# Patient Record
Sex: Male | Born: 1955 | Race: White | Hispanic: No | Marital: Married | Smoking: Never smoker
Health system: Southern US, Community
[De-identification: ages and names within clinical notes are randomized; demographics above are authoritative.]

## PROBLEM LIST (undated history)

## (undated) DIAGNOSIS — B029 Zoster without complications: Secondary | ICD-10-CM

## (undated) DIAGNOSIS — I10 Essential (primary) hypertension: Secondary | ICD-10-CM

## (undated) DIAGNOSIS — F32A Depression, unspecified: Secondary | ICD-10-CM

## (undated) DIAGNOSIS — K219 Gastro-esophageal reflux disease without esophagitis: Secondary | ICD-10-CM

## (undated) DIAGNOSIS — D696 Thrombocytopenia, unspecified: Secondary | ICD-10-CM

## (undated) DIAGNOSIS — K5790 Diverticulosis of intestine, part unspecified, without perforation or abscess without bleeding: Secondary | ICD-10-CM

## (undated) DIAGNOSIS — K649 Unspecified hemorrhoids: Secondary | ICD-10-CM

## (undated) DIAGNOSIS — E039 Hypothyroidism, unspecified: Secondary | ICD-10-CM

## (undated) DIAGNOSIS — N182 Chronic kidney disease, stage 2 (mild): Secondary | ICD-10-CM

## (undated) DIAGNOSIS — D649 Anemia, unspecified: Secondary | ICD-10-CM

## (undated) DIAGNOSIS — F419 Anxiety disorder, unspecified: Secondary | ICD-10-CM

## (undated) DIAGNOSIS — I2510 Atherosclerotic heart disease of native coronary artery without angina pectoris: Principal | ICD-10-CM

## (undated) DIAGNOSIS — E785 Hyperlipidemia, unspecified: Secondary | ICD-10-CM

## (undated) DIAGNOSIS — F329 Major depressive disorder, single episode, unspecified: Secondary | ICD-10-CM

## (undated) DIAGNOSIS — K469 Unspecified abdominal hernia without obstruction or gangrene: Secondary | ICD-10-CM

## (undated) DIAGNOSIS — K759 Inflammatory liver disease, unspecified: Secondary | ICD-10-CM

## (undated) HISTORY — PX: CARDIAC CATHETERIZATION: SHX172

## (undated) HISTORY — DX: Hyperlipidemia, unspecified: E78.5

## (undated) HISTORY — DX: Major depressive disorder, single episode, unspecified: F32.9

## (undated) HISTORY — DX: Unspecified abdominal hernia without obstruction or gangrene: K46.9

## (undated) HISTORY — DX: Zoster without complications: B02.9

## (undated) HISTORY — DX: Essential (primary) hypertension: I10

## (undated) HISTORY — DX: Depression, unspecified: F32.A

## (undated) HISTORY — DX: Diverticulosis of intestine, part unspecified, without perforation or abscess without bleeding: K57.90

## (undated) HISTORY — PX: CARDIOVASCULAR STRESS TEST: SHX262

## (undated) HISTORY — PX: HERNIA REPAIR: SHX51

## (undated) HISTORY — DX: Chronic kidney disease, stage 2 (mild): N18.2

## (undated) HISTORY — DX: Anxiety disorder, unspecified: F41.9

## (undated) HISTORY — DX: Atherosclerotic heart disease of native coronary artery without angina pectoris: I25.10

## (undated) HISTORY — PX: ABDOMINAL HERNIA REPAIR: SHX539

## (undated) HISTORY — PX: TONSILLECTOMY: SUR1361

## (undated) HISTORY — DX: Morbid (severe) obesity due to excess calories: E66.01

## (undated) HISTORY — DX: Unspecified hemorrhoids: K64.9

## (undated) HISTORY — DX: Anemia, unspecified: D64.9

## (undated) HISTORY — DX: Thrombocytopenia, unspecified: D69.6

---

## 1993-05-30 HISTORY — PX: OTHER SURGICAL HISTORY: SHX169

## 1998-08-30 ENCOUNTER — Emergency Department (HOSPITAL_COMMUNITY): Admission: EM | Admit: 1998-08-30 | Discharge: 1998-08-30 | Payer: Self-pay

## 2002-06-11 ENCOUNTER — Encounter: Payer: Self-pay | Admitting: Emergency Medicine

## 2002-06-12 ENCOUNTER — Encounter: Payer: Self-pay | Admitting: Surgery

## 2002-06-12 ENCOUNTER — Inpatient Hospital Stay (HOSPITAL_COMMUNITY): Admission: EM | Admit: 2002-06-12 | Discharge: 2002-06-14 | Payer: Self-pay | Admitting: Emergency Medicine

## 2002-06-14 ENCOUNTER — Encounter: Payer: Self-pay | Admitting: Internal Medicine

## 2003-06-16 ENCOUNTER — Ambulatory Visit (HOSPITAL_COMMUNITY): Admission: RE | Admit: 2003-06-16 | Discharge: 2003-06-16 | Payer: Self-pay | Admitting: Internal Medicine

## 2006-07-30 DIAGNOSIS — I2510 Atherosclerotic heart disease of native coronary artery without angina pectoris: Secondary | ICD-10-CM

## 2006-07-30 HISTORY — DX: Atherosclerotic heart disease of native coronary artery without angina pectoris: I25.10

## 2006-08-30 HISTORY — PX: OTHER SURGICAL HISTORY: SHX169

## 2006-09-09 ENCOUNTER — Ambulatory Visit (HOSPITAL_COMMUNITY): Admission: RE | Admit: 2006-09-09 | Discharge: 2006-09-09 | Payer: Self-pay | Admitting: Cardiology

## 2011-06-01 ENCOUNTER — Encounter (INDEPENDENT_AMBULATORY_CARE_PROVIDER_SITE_OTHER): Payer: Self-pay | Admitting: General Surgery

## 2011-06-01 ENCOUNTER — Ambulatory Visit (INDEPENDENT_AMBULATORY_CARE_PROVIDER_SITE_OTHER): Payer: BC Managed Care – PPO | Admitting: General Surgery

## 2011-06-01 ENCOUNTER — Encounter (INDEPENDENT_AMBULATORY_CARE_PROVIDER_SITE_OTHER): Payer: Self-pay | Admitting: Surgery

## 2011-06-01 VITALS — BP 150/96 | HR 68 | Temp 97.2°F | Resp 16 | Ht 72.0 in | Wt 225.8 lb

## 2011-06-01 DIAGNOSIS — K432 Incisional hernia without obstruction or gangrene: Secondary | ICD-10-CM | POA: Insufficient documentation

## 2011-06-01 NOTE — Patient Instructions (Signed)
Hernia °A hernia occurs when an internal organ pushes out through a weak spot in the abdominal wall. Hernias most commonly occur in the groin and around the navel. Hernias often can be pushed back into place (reduced). Most hernias tend to get worse over time. Some abdominal hernias can get stuck in the opening (irreducible or incarcerated hernia) and cannot be reduced. An irreducible abdominal hernia which is tightly squeezed into the opening is at risk for impaired blood supply (strangulated hernia). A strangulated hernia is a medical emergency. Because of the risk for an irreducible or strangulated hernia, surgery may be recommended to repair a hernia. °CAUSES  °· Heavy lifting.  °· Prolonged coughing.  °· Straining to have a bowel movement.  °· A cut (incision) made during an abdominal surgery.  °HOME CARE INSTRUCTIONS  °· Bed rest is not required. You may continue your normal activities.  °· Avoid lifting more than 10 pounds (4.5 kg) or straining.  °· Cough gently. If you are a smoker it is best to stop. Even the best hernia repair can break down with the continual strain of coughing. Even if you do not have your hernia repaired, a cough will continue to aggravate the problem.  °· Do not wear anything tight over your hernia. Do not try to keep it in with an outside bandage or truss. These can damage abdominal contents if they are trapped within the hernia sac.  °· Eat a normal diet.  °· Avoid constipation. Straining over long periods of time will increase hernia size and encourage breakdown of repairs. If you cannot do this with diet alone, stool softeners may be used.  °SEEK IMMEDIATE MEDICAL CARE IF:  °· You have a fever.  °· You develop increasing abdominal pain.  °· You feel nauseous or vomit.  °· Your hernia is stuck outside the abdomen, looks discolored, feels hard, or is tender.  °· You have any changes in your bowel habits or in the hernia that are unusual for you.  °· You have increased pain or  swelling around the hernia.  °· You cannot push the hernia back in place by applying gentle pressure while lying down.  °MAKE SURE YOU:  °· Understand these instructions.  °· Will watch your condition.  °· Will get help right away if you are not doing well or get worse.  °Document Released: 07/16/2005 Document Revised: 03/28/2011 Document Reviewed: 03/04/2008 °[employer]® Patient Information ©2012 [employer]. °

## 2011-06-01 NOTE — Progress Notes (Signed)
Chief Complaint  Patient presents with  . Other    Eval ventral hernia    HPI Alexander Holloway is a 55 y.o. male.  Referred by Dr. Tyson Dense HPI This is a 55 year old male who underwent an open gastric bypass in [place] in 1994. He was 400 pounds at that time and has lost a significant amount of weight. He is doing very well at this point. He does have a history of a cardiac catheterization about 4 years ago for a syncopal episode which he says was essentially normal. He has had abdominal bulge that goes to the right of his incision present for a number of years. This occasionally causes him some discomfort but is otherwise asymptomatic. It is not tender. It always feels like it goes down when he lies down. He does not have any trouble with bowel movements or any nausea or vomiting associated with it. He comes in today to discuss his options in reference to this hernia.  Past Medical History  Diagnosis Date  . Hypertension   . Hyperlipidemia     Past Surgical History  Procedure Date  . 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 Heart cath 08/2006    Family History  Problem Relation Age of Onset  . Other Mother 23    circulatory  . Heart disease Father 35    Social History History  Substance Use Topics  . Smoking status: Never Smoker   . Smokeless tobacco: Never Used  . Alcohol Use: Yes     social    No Known Allergies  Current Outpatient Prescriptions  Medication Sig Dispense Refill  . CARDIZEM LA 300 MG 24 hr tablet 300 mg daily.       . cloNIDine (CATAPRES) 0.1 MG tablet 0.1 mg 2 (two) times daily.       Marland Kitchen levothyroxine (SYNTHROID, LEVOTHROID) 100 MCG tablet 100 mcg 2 (two) times daily.       . TEVETEN HCT 600-25 MG per tablet 1 tablet daily. Not taking at present per doctor order for the past two weeks. To see PCP today 06/01/11 to confirm if he will be put back on this medication.        Review of Systems Review of Systems  Constitutional: Negative for fever, chills and  unexpected weight change.  HENT: Negative for hearing loss, congestion, sore throat, trouble swallowing and voice change.   Eyes: Negative for visual disturbance.  Respiratory: Negative for cough and wheezing.   Cardiovascular: Negative for chest pain, palpitations and leg swelling.  Gastrointestinal: Negative for nausea, vomiting, abdominal pain, diarrhea, constipation, blood in stool, abdominal distention, anal bleeding and rectal pain.  Genitourinary: Negative for hematuria and difficulty urinating.  Musculoskeletal: Negative for arthralgias.  Skin: Negative for rash and wound.  Neurological: Negative for seizures, syncope, weakness and headaches.  Hematological: Negative for adenopathy. Does not bruise/bleed easily.  Psychiatric/Behavioral: Negative for confusion.    Blood pressure 150/96, pulse 68, temperature 97.2 F (36.2 C), temperature source Temporal, resp. rate 16, height 6' (1.829 m), weight 225 lb 12.8 oz (102.422 kg).  Physical Exam Physical Exam  Constitutional: He appears well-developed and well-nourished.  Eyes: No scleral icterus.  Neck: Neck supple.  Cardiovascular: Normal rate, regular rhythm and normal heart sounds.   Pulmonary/Chest: Effort normal and breath sounds normal. He has no wheezes. He has no rales.  Abdominal: Soft. Bowel sounds are normal. He exhibits no mass. There is no tenderness. A hernia is present.    Lymphadenopathy:  He has no cervical adenopathy.      Assessment    Incisional hernia    Plan    We discussed indications for repairs of hernias including prevention of any further problems as well as the fact that this will likely worsen over time. We discussed at length the hospital course as well as to recovery after her hernia repair. I discussed both a laparoscopic repair with mesh as well as an open repair with mesh. We also discussed component separation. I discussed with him that I would like to obtain a CT scan if he chooses to  have this hernia repaired so that he and I can discuss the best plan of approaching this hernia. We discussed all the risks associated with that. I don't think this is urgent that he have his hernia repaired. He is going to go home and discuss this with his wife and will get back to me.      [name] 06/01/2011, 10:13 AM

## 2011-07-04 ENCOUNTER — Ambulatory Visit (INDEPENDENT_AMBULATORY_CARE_PROVIDER_SITE_OTHER): Payer: BC Managed Care – PPO | Admitting: General Surgery

## 2011-07-04 ENCOUNTER — Encounter (INDEPENDENT_AMBULATORY_CARE_PROVIDER_SITE_OTHER): Payer: Self-pay | Admitting: General Surgery

## 2011-07-04 VITALS — BP 140/80 | HR 64 | Temp 97.0°F | Resp 16 | Ht 72.0 in | Wt 229.4 lb

## 2011-07-04 DIAGNOSIS — K432 Incisional hernia without obstruction or gangrene: Secondary | ICD-10-CM

## 2011-07-04 NOTE — Progress Notes (Signed)
Subjective:     Patient ID: EISSA BUCHBERGER, male   DOB: 03-08-1956, 55 y.o.   MRN: [id number]  HPI This is a 55 year old male who I saw previously with an incisional hernia after an open gastric bypass in the past. He is unchanged since I last saw him. He comes in today with his wife to discuss hernia repair as we did last time. He is very anxious today.  Review of Systems     Objective:   Physical Exam Incisional hernia rlq unchanged since last visit    Assessment:     Incisional hernia    Plan:        We discussed for about 20 minutes of any incisional hernia repair. I discussed both an open and laparoscopic repair. We discussed the risks and benefits associated with both of those. I told him that I would like to obtain a CT scan prior to scheduling him. We discussed the time of repair. They have a cruise set up for January which I think he would be best for them to go on a cruise and then plan on repair after that. We're going to obtain a CT scan and then he will see me in early January plan on scheduling and going over operation more detail.

## 2011-07-06 ENCOUNTER — Telehealth (INDEPENDENT_AMBULATORY_CARE_PROVIDER_SITE_OTHER): Payer: Self-pay

## 2011-07-06 NOTE — Telephone Encounter (Signed)
Called Dr Norris Cross office to see about cardiac clearance on the pt for his surgery that is going to be scheduled in January. The pt was cleared on 06-07-11 and they will fax the clearance to me today./ [facility]

## 2011-07-27 ENCOUNTER — Telehealth (INDEPENDENT_AMBULATORY_CARE_PROVIDER_SITE_OTHER): Payer: Self-pay | Admitting: General Surgery

## 2011-07-27 ENCOUNTER — Ambulatory Visit
Admission: RE | Admit: 2011-07-27 | Discharge: 2011-07-27 | Disposition: A | Discharge status: Home / Self Care | Payer: BC Managed Care – PPO | Source: Ambulatory Visit | Attending: General Surgery | Admitting: General Surgery

## 2011-07-27 DIAGNOSIS — K432 Incisional hernia without obstruction or gangrene: Secondary | ICD-10-CM

## 2011-07-27 MED ORDER — IOHEXOL 300 MG/ML  SOLN
125.0000 mL | Freq: Once | INTRAMUSCULAR | Status: AC | PRN
  Administered 2011-07-27: 125 mL via INTRAVENOUS

## 2011-07-27 NOTE — Telephone Encounter (Signed)
Pt is aware of his CT A/P report and to keep appt on the 08/03/11

## 2011-08-01 ENCOUNTER — Other Ambulatory Visit: Payer: Self-pay

## 2011-08-02 ENCOUNTER — Encounter (INDEPENDENT_AMBULATORY_CARE_PROVIDER_SITE_OTHER): Payer: Self-pay | Admitting: General Surgery

## 2011-08-03 ENCOUNTER — Ambulatory Visit (INDEPENDENT_AMBULATORY_CARE_PROVIDER_SITE_OTHER): Payer: BC Managed Care – PPO | Admitting: General Surgery

## 2011-08-03 ENCOUNTER — Encounter (INDEPENDENT_AMBULATORY_CARE_PROVIDER_SITE_OTHER): Payer: Self-pay | Admitting: General Surgery

## 2011-08-03 VITALS — BP 152/100 | HR 66 | Temp 98.0°F | Ht 72.0 in | Wt 232.8 lb

## 2011-08-03 DIAGNOSIS — K432 Incisional hernia without obstruction or gangrene: Secondary | ICD-10-CM

## 2011-08-03 NOTE — Progress Notes (Signed)
Patient ID: Alexander Holloway, male   DOB: 22-Feb-1956, 56 y.o.   MRN: [id number]  Chief Complaint  Patient presents with  . Pre-op Exam    appt before hernia sx    HPI Alexander Holloway is a 56 y.o. male.   HPI This is a 56 year old male who I've seen a couple of times before. He had an open gastric bypass with a good weight loss results. Recently he has had the onset of a fairly large symptomatic ventral hernia. I sent him to get a CT scan since the last time I saw him and he returns today to schedule the surgery as well as to review his CT scan. This area continue to bother him and he has increasing discomfort. A CT scan shows a fairly large defect with a fair amount of bowel herniated into this. We reviewed the pictures today as well as discussed the etiology of hernia formation.  Past Medical History  Diagnosis Date  . Hypertension   . Hyperlipidemia     Past Surgical History  Procedure Date  . 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 Heart cath 08/2006    Family History  Problem Relation Age of Onset  . Other Mother 64    circulatory  . Heart disease Father 1    Social History History  Substance Use Topics  . Smoking status: Never Smoker   . Smokeless tobacco: Never Used  . Alcohol Use: Yes     social    No Known Allergies  Current Outpatient Prescriptions  Medication Sig Dispense Refill  . CARDIZEM LA 300 MG 24 hr tablet 300 mg daily.       . cloNIDine (CATAPRES) 0.1 MG tablet 0.2 mg 2 (two) times daily.       Marland Kitchen eprosartan (TEVETEN) 600 MG tablet       . levothyroxine (SYNTHROID, LEVOTHROID) 100 MCG tablet 100 mcg 2 (two) times daily.         Review of Systems Review of Systems  Blood pressure 152/100, pulse 66, temperature 98 F (36.7 C), temperature source Temporal, height 6' (1.829 m), weight 232 lb 12.8 oz (105.597 kg), SpO2 98.00%.  Physical Exam Physical Exam  Constitutional: He appears well-developed and well-nourished.  Cardiovascular: Normal rate, regular rhythm  and normal heart sounds.   Pulmonary/Chest: Effort normal and breath sounds normal. He has no wheezes. He has no rales.  Abdominal: Soft. A hernia is present.    Lymphadenopathy:    He has no cervical adenopathy.    Data Reviewed CT ABDOMEN AND PELVIS WITH CONTRAST  Technique: Multidetector CT imaging of the abdomen and pelvis was  performed following the standard protocol during bolus  administration of intravenous contrast.  Contrast: OMNIPAQUE IOHEXOL 300 MG/ML IV SOLN  Comparison: Report from CT abdomen pelvis of November 2003 (images  no longer available.  Findings: The imaged lung bases are well expanded. Small bleb left  lower lobe. Mild cardiomegaly with coronary artery  atherosclerosis. Mild gaseous distention of the distal esophagus.  There is a large midline ventral abdominal wall hernia. Maximal  transverse diameter of the mouth of the hernia is 9.0 cm. The  hernia spans approximately 11 cm in craniocaudal span. The hernia  sac is positioned within the midline and right lateral anterior  abdominal wall. The hernia sac measures approximately 21 cm  transverse diameter by 7.2 cm AP diameter by 13 cm craniocaudal  span. The hernia sac contains multiple normal caliber small bowel  loops, a small  segment of the sigmoid colon, mesenteric fat, and  mesenteric vessels.  It is noted that the patient's sigmoid colon is long and redundant  and is present within the right abdomen/pelvis as well as the left  aspect of the pelvis. There is no evidence of bowel obstruction,  ischemia, or inflammation.  Cholecystectomy. Postsurgical changes of gastric bypass surgery  noted. Intra-abdominal small bowel loops and colon are normal in  caliber.  There is a 2.7 cm simple cyst in the upper pole of the left kidney.  Too small to characterize 5 mm low density lesion in the lower pole  of the left kidney statistically most likely a cyst. Right kidney  is within normal limits.  The  liver, spleen, adrenal glands, and pancreas are within normal  limits.  Negative for ascites or lymphadenopathy.  Abdominal aorta is normal in caliber. There is some  atherosclerotic calcification of the right common iliac artery and  proximal right femoral artery. Atherosclerotic calcification of  branches of the internal iliac arteries bilaterally.  Urinary bladder is not very distended at the time imaging, likely  accounting for apparent bladder wall thickening. The prostate gland  is within normal limits for size. Seminal vesicles are  unremarkable. Inguinal regions appear normal.  There are bilateral pars defects at L5, with approximately 4 mm  anterolisthesis of L5 on S1.  IMPRESSION:  1. Large ventral abdominal wall hernia as described above. The  hernia sac contains multiple small bowel loops, a portion of the  sigmoid colon, mesenteric fat and vasculature.  2. No evidence of bowel obstruction.  3. Simple left renal cyst.  4. Coronary artery atherosclerosis and mild cardiomegaly.  5. Previous gastric bypass surgery and cholecystectomy.  6. Grade 1 spondylolisthesis at L5-S1.    Assessment    Incisional hernia    Plan   We again a long discussion about the pathophysiology of hernia formation. I showed him his CAT scan today and we reviewed the findings on it. He has a medium-sized defect with a fair amount of bowel herniating through this. I recommended again to him today to have this repaired due to the fact that he is at risk for incarceration as well as he's going to have loss of domain at some point with this. I recommended to him today an open ventral hernia repair with mesh. I discussed with him also the possibility of component separation. We'll long discussion about the difference being open versus laparoscopic ventral hernia repair. I think for him an open ventral hernia repair will be the least risk as well as given him the most opportunity to have a durable repair. I  discussed the risks of hernia repair being but not limited to bleeding, infection, infection of the mesh requiring reoperation, wound dehiscence, need to heal by secondary intention, drain placement, ileus, DVT, PE, recurrence of his hernia. He understands all of these risks. We'll plan on scheduling for the second week in February per his request.        Emelia Loron 08/03/2011, 2:31 PM

## 2011-08-28 ENCOUNTER — Encounter (HOSPITAL_COMMUNITY): Payer: Self-pay | Admitting: Pharmacy Technician

## 2011-09-04 NOTE — Pre-Procedure Instructions (Signed)
20 TYTUS STRAHLE  09/04/2011   Your procedure is scheduled on:  Wed. Feb 13 @ 0830  Report to [facility] at 0630 AM.  Call this number if you have problems the morning of surgery: [phone]   Remember:   Do not eat food:After Midnight.  May have clear liquids: up to 4 Hours before arrival.(until 2:30 am)  Clear liquids include soda, tea, black coffee, apple or grape juice, broth.  Take these medicines the morning of surgery with A SIP OF WATER: Cardizem,Clonidine,Synthroid,and Omeprazole   Do not wear jewelry, make-up or nail polish.  Do not wear lotions, powders, or perfumes. You may wear deodorant.  Do not shave 48 hours prior to surgery.  Do not bring valuables to the hospital.  Contacts, dentures or bridgework may not be worn into surgery.  Leave suitcase in the car. After surgery it may be brought to your room.  For patients admitted to the hospital, checkout time is 11:00 AM the day of discharge.   Patients discharged the day of surgery will not be allowed to drive home.  Name and phone number of your driver:   Special Instructions: CHG Shower Use Special Wash: 1/2 bottle night before surgery and 1/2 bottle morning of surgery.   Please read over the following fact sheets that you were given: Pain Booklet, Coughing and Deep Breathing, MRSA Information and Surgical Site Infection Prevention

## 2011-09-05 ENCOUNTER — Encounter (HOSPITAL_COMMUNITY)
Admission: RE | Admit: 2011-09-05 | Discharge: 2011-09-05 | Disposition: A | Discharge status: Home / Self Care | Payer: BC Managed Care – PPO | Source: Ambulatory Visit | Attending: General Surgery | Admitting: General Surgery

## 2011-09-05 ENCOUNTER — Encounter (HOSPITAL_COMMUNITY): Payer: Self-pay

## 2011-09-05 ENCOUNTER — Ambulatory Visit (HOSPITAL_COMMUNITY)
Admission: RE | Admit: 2011-09-05 | Discharge: 2011-09-05 | Disposition: A | Discharge status: Home / Self Care | Payer: BC Managed Care – PPO | Source: Ambulatory Visit | Attending: Anesthesiology | Admitting: Anesthesiology

## 2011-09-05 DIAGNOSIS — I10 Essential (primary) hypertension: Secondary | ICD-10-CM | POA: Insufficient documentation

## 2011-09-05 DIAGNOSIS — Z01818 Encounter for other preprocedural examination: Secondary | ICD-10-CM | POA: Insufficient documentation

## 2011-09-05 DIAGNOSIS — Z01812 Encounter for preprocedural laboratory examination: Secondary | ICD-10-CM | POA: Insufficient documentation

## 2011-09-05 DIAGNOSIS — K439 Ventral hernia without obstruction or gangrene: Secondary | ICD-10-CM | POA: Insufficient documentation

## 2011-09-05 HISTORY — DX: Gastro-esophageal reflux disease without esophagitis: K21.9

## 2011-09-05 HISTORY — DX: Hypothyroidism, unspecified: E03.9

## 2011-09-05 HISTORY — DX: Inflammatory liver disease, unspecified: K75.9

## 2011-09-05 LAB — CBC
HCT: 37.7 % — ABNORMAL LOW (ref 39.0–52.0)
MCHC: 34.2 g/dL (ref 30.0–36.0)
MCV: 93.5 fL (ref 78.0–100.0)
RDW: 12.4 % (ref 11.5–15.5)
WBC: 8.6 10*3/uL (ref 4.0–10.5)

## 2011-09-05 LAB — COMPREHENSIVE METABOLIC PANEL
BUN: 14 mg/dL (ref 6–23)
CO2: 24 mEq/L (ref 19–32)
Chloride: 103 mEq/L (ref 96–112)
Creatinine, Ser: 1.21 mg/dL (ref 0.50–1.35)
GFR calc non Af Amer: 65 mL/min — ABNORMAL LOW (ref >90)
Glucose, Bld: 93 mg/dL (ref 70–99)
Total Bilirubin: 0.4 mg/dL (ref 0.3–1.2)

## 2011-09-05 LAB — APTT: aPTT: 28 seconds (ref 24–37)

## 2011-09-05 LAB — PROTIME-INR: Prothrombin Time: 14 seconds (ref 11.6–15.2)

## 2011-09-07 ENCOUNTER — Encounter (INDEPENDENT_AMBULATORY_CARE_PROVIDER_SITE_OTHER): Payer: Self-pay | Admitting: General Surgery

## 2011-09-07 ENCOUNTER — Ambulatory Visit (INDEPENDENT_AMBULATORY_CARE_PROVIDER_SITE_OTHER): Payer: BC Managed Care – PPO | Admitting: General Surgery

## 2011-09-07 VITALS — BP 118/82 | HR 68 | Temp 97.8°F | Resp 18 | Ht 72.0 in | Wt 229.4 lb

## 2011-09-07 DIAGNOSIS — K469 Unspecified abdominal hernia without obstruction or gangrene: Secondary | ICD-10-CM | POA: Insufficient documentation

## 2011-09-07 DIAGNOSIS — K432 Incisional hernia without obstruction or gangrene: Secondary | ICD-10-CM

## 2011-09-07 NOTE — Progress Notes (Signed)
Patient ID: Alexander Holloway, male   DOB: 12/31/1955, 55 y.o.   MRN: [id number] HPI  This is a 55-year-old male who I've seen a couple of times before. He had an open gastric bypass with a good weight loss results. Recently he has had the onset of a fairly large symptomatic ventral hernia. I sent him to get a CT scan since the last time I saw him and he returns today to schedule the surgery as well as to review his CT scan. This area continue to bother him and he has increasing discomfort. A CT scan shows a fairly large defect with a fair amount of bowel herniated into this. He returns today without any significant change.  This hernia is more sore and he thinks is larger than the last time I saw him.  Past Medical History   Diagnosis  Date   .  Hypertension    .  Hyperlipidemia     Past Surgical History   Procedure  Date   .  Gastic bypass  05/1993   .  Heart cath  08/2006    Family History   Problem  Relation  Age of Onset   .  Other  Mother  54      circulatory    .  Heart disease  Father  47    Social History  History   Substance Use Topics   .  Smoking status:  Never Smoker   .  Smokeless tobacco:  Never Used   .  Alcohol Use:  Yes      social    No Known Allergies  Current Outpatient Prescriptions   Medication  Sig  Dispense  Refill   .  CARDIZEM LA 300 MG 24 hr tablet  300 mg daily.     .  cloNIDine (CATAPRES) 0.1 MG tablet  0.2 mg 2 (two) times daily.     .  eprosartan (TEVETEN) 600 MG tablet      .  levothyroxine (SYNTHROID, LEVOTHROID) 100 MCG tablet  100 mcg 2 (two) times daily.       Physical Exam  Physical Exam  Constitutional: He appears well-developed and well-nourished.  Cardiovascular: Normal rate, regular rhythm and normal heart sounds.  Pulmonary/Chest: Effort normal and breath sounds normal. He has no wheezes. He has no rales.  Abdominal: Soft. A hernia is present. Well healed midline incision with large rlq hernia, mildly tender, difficult to reduce all of  this   Lymphadenopathy:  He has no cervical adenopathy.   Data Reviewed  CT ABDOMEN AND PELVIS WITH CONTRAST  Technique: Multidetector CT imaging of the abdomen and pelvis was  performed following the standard protocol during bolus  administration of intravenous contrast.  Contrast: 125mL OMNIPAQUE IOHEXOL 300 MG/ML IV SOLN  Comparison: Report from CT abdomen pelvis of November 2003 (images  no longer available.  Findings: The imaged lung bases are well expanded. Small bleb left  lower lobe. Mild cardiomegaly with coronary artery  atherosclerosis. Mild gaseous distention of the distal esophagus.  There is a large midline ventral abdominal wall hernia. Maximal  transverse diameter of the mouth of the hernia is 9.0 cm. The  hernia spans approximately 11 cm in craniocaudal span. The hernia  sac is positioned within the midline and right lateral anterior  abdominal wall. The hernia sac measures approximately 21 cm  transverse diameter by 7.2 cm AP diameter by 13 cm craniocaudal  span. The hernia sac contains multiple normal caliber small bowel    loops, a small segment of the sigmoid colon, mesenteric fat, and  mesenteric vessels.  It is noted that the patient's sigmoid colon is long and redundant  and is present within the right abdomen/pelvis as well as the left  aspect of the pelvis. There is no evidence of bowel obstruction,  ischemia, or inflammation.  Cholecystectomy. Postsurgical changes of gastric bypass surgery  noted. Intra-abdominal small bowel loops and colon are normal in  caliber.  There is a 2.7 cm simple cyst in the upper pole of the left kidney.  Too small to characterize 5 mm low density lesion in the lower pole  of the left kidney statistically most likely a cyst. Right kidney  is within normal limits.  The liver, spleen, adrenal glands, and pancreas are within normal  limits.  Negative for ascites or lymphadenopathy.  Abdominal aorta is normal in caliber. There  is some  atherosclerotic calcification of the right common iliac artery and  proximal right femoral artery. Atherosclerotic calcification of  branches of the internal iliac arteries bilaterally.  Urinary bladder is not very distended at the time imaging, likely  accounting for apparent bladder wall thickening. The prostate gland  is within normal limits for size. Seminal vesicles are  unremarkable. Inguinal regions appear normal.  There are bilateral pars defects at L5, with approximately 4 mm  anterolisthesis of L5 on S1.  IMPRESSION:  1. Large ventral abdominal wall hernia as described above. The  hernia sac contains multiple small bowel loops, a portion of the  sigmoid colon, mesenteric fat and vasculature.  2. No evidence of bowel obstruction.  3. Simple left renal cyst.  4. Coronary artery atherosclerosis and mild cardiomegaly.  5. Previous gastric bypass surgery and cholecystectomy.  6. Grade 1 spondylolisthesis at L5-S1.  Assessment   Incisional hernia   Plan . I recommended to him today an open ventral hernia repair with mesh. I discussed with him also the possibility of component separation. We'll long discussion about the difference being open versus laparoscopic ventral hernia repair. I think for him an open ventral hernia repair will be the least risk as well as given him the most opportunity to have a durable repair. I discussed the risks of hernia repair being but not limited to bleeding, infection, infection of the mesh requiring reoperation, wound dehiscence, need to heal by secondary intention, drain placement, ileus, DVT, PE, recurrence of his hernia. He understands all of these risks.     

## 2011-09-11 MED ORDER — CEFAZOLIN SODIUM-DEXTROSE 2-3 GM-% IV SOLR
2.0000 g | INTRAVENOUS | Status: AC
  Administered 2011-09-12: 2 g via INTRAVENOUS
  Filled 2011-09-11: qty 50

## 2011-09-12 ENCOUNTER — Encounter (HOSPITAL_COMMUNITY): Payer: Self-pay | Admitting: Anesthesiology

## 2011-09-12 ENCOUNTER — Other Ambulatory Visit (INDEPENDENT_AMBULATORY_CARE_PROVIDER_SITE_OTHER): Payer: Self-pay | Admitting: General Surgery

## 2011-09-12 ENCOUNTER — Encounter (HOSPITAL_COMMUNITY): Payer: Self-pay

## 2011-09-12 ENCOUNTER — Inpatient Hospital Stay (HOSPITAL_COMMUNITY)
Admission: RE | Admit: 2011-09-12 | Discharge: 2011-09-19 | DRG: 554 | Disposition: A | Discharge status: Home / Self Care | Payer: BC Managed Care – PPO | Source: Ambulatory Visit | Attending: General Surgery | Admitting: General Surgery

## 2011-09-12 ENCOUNTER — Encounter (HOSPITAL_COMMUNITY)
Admission: RE | Disposition: A | Discharge status: Home / Self Care | Payer: Self-pay | Source: Ambulatory Visit | Attending: General Surgery

## 2011-09-12 ENCOUNTER — Ambulatory Visit (HOSPITAL_COMMUNITY): Payer: BC Managed Care – PPO | Admitting: Anesthesiology

## 2011-09-12 DIAGNOSIS — D62 Acute posthemorrhagic anemia: Secondary | ICD-10-CM | POA: Diagnosis not present

## 2011-09-12 DIAGNOSIS — I10 Essential (primary) hypertension: Secondary | ICD-10-CM | POA: Diagnosis present

## 2011-09-12 DIAGNOSIS — K432 Incisional hernia without obstruction or gangrene: Principal | ICD-10-CM | POA: Diagnosis present

## 2011-09-12 DIAGNOSIS — K439 Ventral hernia without obstruction or gangrene: Secondary | ICD-10-CM

## 2011-09-12 DIAGNOSIS — Y834 Other reconstructive surgery as the cause of abnormal reaction of the patient, or of later complication, without mention of misadventure at the time of the procedure: Secondary | ICD-10-CM | POA: Diagnosis not present

## 2011-09-12 DIAGNOSIS — M4310 Spondylolisthesis, site unspecified: Secondary | ICD-10-CM | POA: Diagnosis present

## 2011-09-12 DIAGNOSIS — E039 Hypothyroidism, unspecified: Secondary | ICD-10-CM | POA: Diagnosis present

## 2011-09-12 DIAGNOSIS — I2510 Atherosclerotic heart disease of native coronary artery without angina pectoris: Secondary | ICD-10-CM | POA: Diagnosis present

## 2011-09-12 DIAGNOSIS — E785 Hyperlipidemia, unspecified: Secondary | ICD-10-CM | POA: Diagnosis present

## 2011-09-12 DIAGNOSIS — K219 Gastro-esophageal reflux disease without esophagitis: Secondary | ICD-10-CM | POA: Diagnosis present

## 2011-09-12 DIAGNOSIS — Y9210 Unspecified residential institution as the place of occurrence of the external cause: Secondary | ICD-10-CM | POA: Diagnosis not present

## 2011-09-12 DIAGNOSIS — IMO0002 Reserved for concepts with insufficient information to code with codable children: Secondary | ICD-10-CM | POA: Diagnosis not present

## 2011-09-12 DIAGNOSIS — N179 Acute kidney failure, unspecified: Secondary | ICD-10-CM | POA: Diagnosis not present

## 2011-09-12 HISTORY — PX: VENTRAL HERNIA REPAIR: SHX424

## 2011-09-12 HISTORY — PX: PANNICULECTOMY: SHX5360

## 2011-09-12 SURGERY — REPAIR, HERNIA, VENTRAL
Anesthesia: General | Site: Abdomen | Wound class: Clean

## 2011-09-12 MED ORDER — DIPHENHYDRAMINE HCL 12.5 MG/5ML PO ELIX
12.5000 mg | ORAL_SOLUTION | Freq: Four times a day (QID) | ORAL | Status: DC | PRN
  Filled 2011-09-12: qty 5

## 2011-09-12 MED ORDER — NALOXONE HCL 0.4 MG/ML IJ SOLN
0.4000 mg | INTRAMUSCULAR | Status: DC | PRN
  Filled 2011-09-12: qty 1

## 2011-09-12 MED ORDER — DILTIAZEM HCL ER COATED BEADS 300 MG PO TB24: 300.0000 mg | ORAL_TABLET | Freq: Every day | ORAL | Status: DC

## 2011-09-12 MED ORDER — NEOSTIGMINE METHYLSULFATE 1 MG/ML IJ SOLN
INTRAMUSCULAR | Status: DC | PRN
  Administered 2011-09-12: 5 mg via INTRAVENOUS

## 2011-09-12 MED ORDER — PANTOPRAZOLE SODIUM 40 MG IV SOLR
40.0000 mg | Freq: Every day | INTRAVENOUS | Status: DC
  Administered 2011-09-12 – 2011-09-16 (×5): 40 mg via INTRAVENOUS
  Filled 2011-09-12 (×6): qty 40

## 2011-09-12 MED ORDER — MORPHINE SULFATE (PF) 1 MG/ML IV SOLN
INTRAVENOUS | Status: AC
  Filled 2011-09-12: qty 25

## 2011-09-12 MED ORDER — MORPHINE SULFATE (PF) 1 MG/ML IV SOLN
INTRAVENOUS | Status: DC
  Administered 2011-09-12: 13.5 mg via INTRAVENOUS
  Administered 2011-09-12 (×2): via INTRAVENOUS
  Administered 2011-09-12: 13.5 mg via INTRAVENOUS
  Administered 2011-09-13: 06:00:00 via INTRAVENOUS
  Administered 2011-09-13: 7.5 mg via INTRAVENOUS
  Administered 2011-09-13: 9 mg via INTRAVENOUS
  Administered 2011-09-13: 10.06 mg via INTRAVENOUS
  Administered 2011-09-13: 6 mg via INTRAVENOUS
  Administered 2011-09-13: 7.5 mg via INTRAVENOUS
  Administered 2011-09-13: 15:00:00 via INTRAVENOUS
  Administered 2011-09-13: 9 mg via INTRAVENOUS
  Administered 2011-09-14: 1.5 mg via INTRAVENOUS
  Administered 2011-09-14: 7.5 mg via INTRAVENOUS
  Administered 2011-09-14: 1.5 mg via INTRAVENOUS
  Administered 2011-09-14: 16:00:00 via INTRAVENOUS
  Administered 2011-09-14: 25 mL via INTRAVENOUS
  Administered 2011-09-14: 6 mg via INTRAVENOUS
  Administered 2011-09-15: 04:00:00 via INTRAVENOUS
  Administered 2011-09-15: 3 mL via INTRAVENOUS
  Administered 2011-09-15: 12 mg via INTRAVENOUS
  Administered 2011-09-15: 15:00:00 via INTRAVENOUS
  Administered 2011-09-15 (×2): 10.5 mg via INTRAVENOUS
  Administered 2011-09-15: 2.62 mg via INTRAVENOUS
  Administered 2011-09-15: 9 mg via INTRAVENOUS
  Administered 2011-09-16: 18:00:00 via INTRAVENOUS
  Administered 2011-09-16 (×2): 6 mg via INTRAVENOUS
  Administered 2011-09-16 – 2011-09-17 (×3): 3 mg via INTRAVENOUS
  Filled 2011-09-12: qty 0

## 2011-09-12 MED ORDER — DIPHENHYDRAMINE HCL 50 MG/ML IJ SOLN
12.5000 mg | Freq: Four times a day (QID) | INTRAMUSCULAR | Status: DC | PRN
  Filled 2011-09-12: qty 0.25

## 2011-09-12 MED ORDER — HYDROMORPHONE HCL PF 1 MG/ML IJ SOLN
INTRAMUSCULAR | Status: AC
  Administered 2011-09-12: 0.5 mg via INTRAVENOUS
  Filled 2011-09-12: qty 1

## 2011-09-12 MED ORDER — ONDANSETRON HCL 4 MG/2ML IJ SOLN
4.0000 mg | Freq: Four times a day (QID) | INTRAMUSCULAR | Status: DC | PRN
  Administered 2011-09-13: 4 mg via INTRAVENOUS
  Filled 2011-09-12 (×2): qty 2

## 2011-09-12 MED ORDER — SODIUM CHLORIDE 0.9 % IV SOLN
INTRAVENOUS | Status: DC
  Administered 2011-09-12 – 2011-09-15 (×7): via INTRAVENOUS
  Administered 2011-09-15: 125 mL/h via INTRAVENOUS
  Administered 2011-09-16 (×3): via INTRAVENOUS
  Administered 2011-09-16: 125 mL via INTRAVENOUS
  Administered 2011-09-17: 20 mL/h via INTRAVENOUS
  Administered 2011-09-18: 10 mL via INTRAVENOUS

## 2011-09-12 MED ORDER — CEFAZOLIN SODIUM-DEXTROSE 2-3 GM-% IV SOLR
2.0000 g | Freq: Three times a day (TID) | INTRAVENOUS | Status: AC
  Administered 2011-09-12 – 2011-09-13 (×3): 2 g via INTRAVENOUS
  Filled 2011-09-12 (×3): qty 50

## 2011-09-12 MED ORDER — HYDROMORPHONE HCL PF 1 MG/ML IJ SOLN
0.2500 mg | INTRAMUSCULAR | Status: DC | PRN
  Administered 2011-09-12 (×4): 0.5 mg via INTRAVENOUS

## 2011-09-12 MED ORDER — DILTIAZEM HCL ER COATED BEADS 300 MG PO CP24
300.0000 mg | ORAL_CAPSULE | Freq: Every day | ORAL | Status: DC
  Administered 2011-09-12 – 2011-09-13 (×2): 300 mg via ORAL
  Filled 2011-09-12 (×3): qty 1

## 2011-09-12 MED ORDER — ONDANSETRON HCL 4 MG/2ML IJ SOLN
INTRAMUSCULAR | Status: DC | PRN
  Administered 2011-09-12: 4 mg via INTRAVENOUS

## 2011-09-12 MED ORDER — CHLORHEXIDINE GLUCONATE 0.12 % MT SOLN
15.0000 mL | Freq: Two times a day (BID) | OROMUCOSAL | Status: DC
  Administered 2011-09-12 – 2011-09-17 (×11): 15 mL via OROMUCOSAL
  Filled 2011-09-12 (×10): qty 15

## 2011-09-12 MED ORDER — BIOTENE DRY MOUTH MT LIQD
15.0000 mL | Freq: Two times a day (BID) | OROMUCOSAL | Status: DC
  Administered 2011-09-12 – 2011-09-17 (×8): 15 mL via OROMUCOSAL

## 2011-09-12 MED ORDER — ONDANSETRON HCL 4 MG/2ML IJ SOLN: 4.0000 mg | Freq: Four times a day (QID) | INTRAMUSCULAR | Status: DC | PRN

## 2011-09-12 MED ORDER — ACETAMINOPHEN 650 MG RE SUPP: 650.0000 mg | Freq: Four times a day (QID) | RECTAL | Status: DC | PRN

## 2011-09-12 MED ORDER — MIDAZOLAM HCL 5 MG/5ML IJ SOLN
INTRAMUSCULAR | Status: DC | PRN
  Administered 2011-09-12: 2 mg via INTRAVENOUS

## 2011-09-12 MED ORDER — VECURONIUM BROMIDE 10 MG IV SOLR
INTRAVENOUS | Status: DC | PRN
  Administered 2011-09-12: 2 mg via INTRAVENOUS
  Administered 2011-09-12: 10 mg via INTRAVENOUS
  Administered 2011-09-12 (×2): 2 mg via INTRAVENOUS

## 2011-09-12 MED ORDER — HEPARIN SODIUM (PORCINE) 5000 UNIT/ML IJ SOLN
5000.0000 [IU] | Freq: Once | INTRAMUSCULAR | Status: AC
  Administered 2011-09-12: 5000 [IU] via SUBCUTANEOUS
  Filled 2011-09-12: qty 1

## 2011-09-12 MED ORDER — SODIUM CHLORIDE 0.9 % IJ SOLN: 9.0000 mL | INTRAMUSCULAR | Status: DC | PRN

## 2011-09-12 MED ORDER — PROPOFOL 10 MG/ML IV EMUL
INTRAVENOUS | Status: DC | PRN
  Administered 2011-09-12: 200 mg via INTRAVENOUS

## 2011-09-12 MED ORDER — 0.9 % SODIUM CHLORIDE (POUR BTL) OPTIME
TOPICAL | Status: DC | PRN
  Administered 2011-09-12 (×3): 1000 mL

## 2011-09-12 MED ORDER — LACTATED RINGERS IV SOLN
INTRAVENOUS | Status: DC | PRN
  Administered 2011-09-12 (×3): via INTRAVENOUS

## 2011-09-12 MED ORDER — ACETAMINOPHEN 325 MG PO TABS
650.0000 mg | ORAL_TABLET | Freq: Four times a day (QID) | ORAL | Status: DC | PRN
  Administered 2011-09-13 – 2011-09-19 (×7): 650 mg via ORAL
  Filled 2011-09-12 (×7): qty 2

## 2011-09-12 MED ORDER — FENTANYL CITRATE 0.05 MG/ML IJ SOLN
INTRAMUSCULAR | Status: DC | PRN
  Administered 2011-09-12: 50 ug via INTRAVENOUS
  Administered 2011-09-12: 100 ug via INTRAVENOUS
  Administered 2011-09-12: 150 ug via INTRAVENOUS
  Administered 2011-09-12: 100 ug via INTRAVENOUS
  Administered 2011-09-12 (×2): 50 ug via INTRAVENOUS

## 2011-09-12 MED ORDER — ATROPINE SULFATE 0.4 MG/ML IJ SOLN
INTRAMUSCULAR | Status: DC | PRN
  Administered 2011-09-12: .3 mg via INTRAVENOUS

## 2011-09-12 MED ORDER — EPHEDRINE SULFATE 50 MG/ML IJ SOLN
INTRAMUSCULAR | Status: DC | PRN
  Administered 2011-09-12: 10 mg via INTRAVENOUS

## 2011-09-12 MED ORDER — GLYCOPYRROLATE 0.2 MG/ML IJ SOLN
INTRAMUSCULAR | Status: DC | PRN
  Administered 2011-09-12: .8 mg via INTRAVENOUS
  Administered 2011-09-12: 0.2 mg via INTRAVENOUS

## 2011-09-12 MED ORDER — CLONIDINE HCL 0.2 MG PO TABS
0.2000 mg | ORAL_TABLET | Freq: Two times a day (BID) | ORAL | Status: DC
  Administered 2011-09-12: 0.2 mg via ORAL
  Filled 2011-09-12 (×4): qty 1

## 2011-09-12 SURGICAL SUPPLY — 52 items
BINDER ABD UNIV 10 28-50 (GAUZE/BANDAGES/DRESSINGS) IMPLANT
BINDER ABDOM UNIV 10 (GAUZE/BANDAGES/DRESSINGS) ×2
BLADE SURG ROTATE 9660 (MISCELLANEOUS) ×1 IMPLANT
CANISTER SUCTION 2500CC (MISCELLANEOUS) ×2 IMPLANT
CHLORAPREP W/TINT 26ML (MISCELLANEOUS) ×2 IMPLANT
CLOTH BEACON ORANGE TIMEOUT ST (SAFETY) ×2 IMPLANT
COVER SURGICAL LIGHT HANDLE (MISCELLANEOUS) ×2 IMPLANT
DECANTER SPIKE VIAL GLASS SM (MISCELLANEOUS) ×1 IMPLANT
DRAIN CHANNEL 19F RND (DRAIN) ×2 IMPLANT
DRAPE LAPAROSCOPIC ABDOMINAL (DRAPES) ×1 IMPLANT
DRAPE LAPAROTOMY T 102X78X121 (DRAPES) ×2 IMPLANT
ELECT CAUTERY BLADE 6.4 (BLADE) ×2 IMPLANT
ELECT REM PT RETURN 9FT ADLT (ELECTROSURGICAL) ×2
ELECTRODE REM PT RTRN 9FT ADLT (ELECTROSURGICAL) ×1 IMPLANT
EVACUATOR SILICONE 100CC (DRAIN) ×2 IMPLANT
GAUZE SPONGE 4X4 12PLY STRL LF (GAUZE/BANDAGES/DRESSINGS) ×1 IMPLANT
GLOVE BIO SURGEON STRL SZ7 (GLOVE) ×3 IMPLANT
GLOVE BIO SURGEON STRL SZ7.5 (GLOVE) ×1 IMPLANT
GLOVE BIOGEL PI IND STRL 6.5 (GLOVE) IMPLANT
GLOVE BIOGEL PI IND STRL 7.0 (GLOVE) IMPLANT
GLOVE BIOGEL PI IND STRL 7.5 (GLOVE) ×1 IMPLANT
GLOVE BIOGEL PI INDICATOR 6.5 (GLOVE) ×1
GLOVE BIOGEL PI INDICATOR 7.0 (GLOVE) ×1
GLOVE BIOGEL PI INDICATOR 7.5 (GLOVE) ×1
GLOVE ECLIPSE 6.5 STRL STRAW (GLOVE) ×2 IMPLANT
GLOVE SURG SIGNA 7.5 PF LTX (GLOVE) ×1 IMPLANT
GLOVE SURG SS PI 6.5 STRL IVOR (GLOVE) ×1 IMPLANT
GOWN STRL NON-REIN LRG LVL3 (GOWN DISPOSABLE) ×6 IMPLANT
GOWN STRL REIN XL XLG (GOWN DISPOSABLE) ×1 IMPLANT
KIT BASIN OR (CUSTOM PROCEDURE TRAY) ×2 IMPLANT
KIT ROOM TURNOVER OR (KITS) ×2 IMPLANT
MESH PHYSIO OVAL 20X25CM (Mesh General) ×1 IMPLANT
NS IRRIG 1000ML POUR BTL (IV SOLUTION) ×2 IMPLANT
PACK GENERAL/GYN (CUSTOM PROCEDURE TRAY) ×2 IMPLANT
PAD ARMBOARD 7.5X6 YLW CONV (MISCELLANEOUS) ×2 IMPLANT
SPECIMEN JAR LARGE (MISCELLANEOUS) ×1 IMPLANT
SPONGE GAUZE 4X4 12PLY (GAUZE/BANDAGES/DRESSINGS) IMPLANT
SPONGE LAP 18X18 X RAY DECT (DISPOSABLE) ×4 IMPLANT
STAPLER VISISTAT 35W (STAPLE) ×2 IMPLANT
SUT ETHILON 2 0 FS 18 (SUTURE) ×2 IMPLANT
SUT NOV 1 T60/GS (SUTURE) IMPLANT
SUT NOVA NAB GS-21 0 18 T12 DT (SUTURE) IMPLANT
SUT PROLENE 0 CT 1 CR/8 (SUTURE) ×5 IMPLANT
SUT VIC AB 2-0 SH 18 (SUTURE) ×4 IMPLANT
SUT VIC AB 3-0 SH 18 (SUTURE) ×2 IMPLANT
SUT VICRYL AB 2 0 TIES (SUTURE) ×2 IMPLANT
TAPE CLOTH SURG 4X10 WHT LF (GAUZE/BANDAGES/DRESSINGS) ×1 IMPLANT
TOWEL OR 17X24 6PK STRL BLUE (TOWEL DISPOSABLE) ×2 IMPLANT
TOWEL OR 17X26 10 PK STRL BLUE (TOWEL DISPOSABLE) ×2 IMPLANT
TOWEL OR NON WOVEN STRL DISP B (DISPOSABLE) ×1 IMPLANT
TRAY FOLEY CATH 14FRSI W/METER (CATHETERS) ×1 IMPLANT
WATER STERILE IRR 1000ML POUR (IV SOLUTION) IMPLANT

## 2011-09-12 NOTE — Preoperative (Signed)
Beta Blockers   Reason not to administer Beta Blockers:Not Applicable 

## 2011-09-12 NOTE — Op Note (Signed)
Preoperative diagnosis: Incisional hernia Postoperative diagnosis: Same as above Procedure: #1 open incisional hernia repair with 20 x 25 cm physio-mesh #2 bilateral external abdominal oblique component separation Surgeon: Dr. Harden Mo Asst.: Dr. Carman Ching Anesthesia: Gen. Endotracheal Drains: 2 19 French Blake drains to subcutaneous space Complications: None Estimated blood loss: 150 cc Sponge and needle count correct x2 at operation Disposition of patient to recovery room in stable condition  Indications: This is a 56 year old male who underwent an open gastric bypass a number of years ago who has had an increasingly symptomatic ventral hernia. I saw him in the office and we discussed repair. Over some time the symptoms have increased. I am unable to reduce this in the office. He and I discussed multiple options and I recommended an open ventral hernia repair likely with component separation. He is agreeable to this. We discussed the risks and benefits multiple times at length.  Procedure: After informed consent was obtained the patient was taken to the operating room. He was administered 2 g of intravenous cefazolin. Sequential compression devices were placed on his legs prior to beginning. He was then placed under general endotracheal anesthesia without complication. A Foley catheter and orogastric tube were placed. His abdomen was then prepped and draped in the standard sterile surgical fashion. A surgical timeout was performed.  I was able to reduce this hernia once he was asleep. I elected to do this through a  transverse incision.I wanted to excise this hernia sac at the same time it was very redundant on that side. I then made an elliptical incision encompassing the hernia sac as well as the soft tissue surrounding it. I then excised this hernia sac which encompassed really the right side of his abdomen along with the skin overlying it as well as the left side to make it  symmetrical. I then carried this out down to the defect. I was able to enter into the hernia sac and all of the bowel was reduced. Eventually I excised this in hernia sac in its entirety. I did pass this off the table as a specimen. Once I had done this it was about a 8x8 cm defect. The tissue superiorly at his prior midline incision was weak and I wanted to cover this as well. I elected to use a 20 x 25 cm piece of Physiomesh so that I had plenty of overlap. I had well over 5 cm of overlap in all directions. I used 0 Prolene sutures and secured the mesh to the fascia with full thickness U stitches. The first time I did this I was concerned that I had incorporated a small amount of omentum. I removed some stitches then we did all of these. I pulled this up and then it appeared that I had gotten the mesh into good position and this was sealed all the way around. At this point and I did do bilateral external abdominal oblique releases. This allowed me then to close the midline tissue over the mesh with 0 Prolene sutures. This was done without any tension. Once I had done this I then irrigated and obtained hemostasis. I placed 2 19 French Blake drains in this space. I then closed the incision in multiple layers with 2-0 Vicryl, 3-0 Vicryl, and stapled the skin. I secured the drains with 2-0 nylon sutures. I then placed bacitracin and sterile dressings. He tolerated this well was extubated and transferred to recovery room in stable condition.

## 2011-09-12 NOTE — Progress Notes (Signed)
UR of chart complete.  

## 2011-09-12 NOTE — Interval H&P Note (Signed)
History and Physical Interval Note:  09/12/2011 8:25 AM  Alexander Holloway  has presented today for surgery, with the diagnosis of componeal separation   The various methods of treatment have been discussed with the patient and family. After consideration of risks, benefits and other options for treatment, the patient has consented to  Procedure(s) (LRB): Open ventral hernia repair with mesh, possible component separation as a surgical intervention .  The patients' history has been reviewed, patient examined, no change in status, stable for surgery.  I have reviewed the patients' chart and labs.  Questions were answered to the patient's satisfaction.     [name]

## 2011-09-12 NOTE — Anesthesia Postprocedure Evaluation (Signed)
Anesthesia Post Note  Patient: Alexander Holloway  Procedure(s) Performed: Procedure(s) (LRB): HERNIA REPAIR VENTRAL ADULT (N/A) PANNICULECTOMY (N/A)  Anesthesia type: General  Patient location: PACU  Post pain: Pain level controlled and Adequate analgesia  Post assessment: Post-op Vital signs reviewed, Patient's Cardiovascular Status Stable, Respiratory Function Stable, Patent Airway and Pain level controlled  Last Vitals:  Filed Vitals:   09/12/11 1202  BP:   Pulse: 70  Temp: 36.4 C  Resp: 24    Post vital signs: Reviewed and stable  Level of consciousness: awake, alert  and oriented  Complications: No apparent anesthesia complications

## 2011-09-12 NOTE — H&P (View-Only) (Signed)
Patient ID: Alexander Holloway, male   DOB: 09/29/1955, 56 y.o.   MRN: [id number] HPI  This is a 56 year old male who I've seen a couple of times before. He had an open gastric bypass with a good weight loss results. Recently he has had the onset of a fairly large symptomatic ventral hernia. I sent him to get a CT scan since the last time I saw him and he returns today to schedule the surgery as well as to review his CT scan. This area continue to bother him and he has increasing discomfort. A CT scan shows a fairly large defect with a fair amount of bowel herniated into this. He returns today without any significant change.  This hernia is more sore and he thinks is larger than the last time I saw him.  Past Medical History   Diagnosis  Date   .  Hypertension    .  Hyperlipidemia     Past Surgical History   Procedure  Date   .  Gastic bypass  05/1993   .  Heart cath  08/2006    Family History   Problem  Relation  Age of Onset   .  Other  Mother  28      circulatory    .  Heart disease  Father  10    Social History  History   Substance Use Topics   .  Smoking status:  Never Smoker   .  Smokeless tobacco:  Never Used   .  Alcohol Use:  Yes      social    No Known Allergies  Current Outpatient Prescriptions   Medication  Sig  Dispense  Refill   .  CARDIZEM LA 300 MG 24 hr tablet  300 mg daily.     .  cloNIDine (CATAPRES) 0.1 MG tablet  0.2 mg 2 (two) times daily.     Marland Kitchen  eprosartan (TEVETEN) 600 MG tablet      .  levothyroxine (SYNTHROID, LEVOTHROID) 100 MCG tablet  100 mcg 2 (two) times daily.       Physical Exam  Physical Exam  Constitutional: He appears well-developed and well-nourished.  Cardiovascular: Normal rate, regular rhythm and normal heart sounds.  Pulmonary/Chest: Effort normal and breath sounds normal. He has no wheezes. He has no rales.  Abdominal: Soft. A hernia is present. Well healed midline incision with large rlq hernia, mildly tender, difficult to reduce all of  this   Lymphadenopathy:  He has no cervical adenopathy.   Data Reviewed  CT ABDOMEN AND PELVIS WITH CONTRAST  Technique: Multidetector CT imaging of the abdomen and pelvis was  performed following the standard protocol during bolus  administration of intravenous contrast.  Contrast: OMNIPAQUE IOHEXOL 300 MG/ML IV SOLN  Comparison: Report from CT abdomen pelvis of November 2003 (images  no longer available.  Findings: The imaged lung bases are well expanded. Small bleb left  lower lobe. Mild cardiomegaly with coronary artery  atherosclerosis. Mild gaseous distention of the distal esophagus.  There is a large midline ventral abdominal wall hernia. Maximal  transverse diameter of the mouth of the hernia is 9.0 cm. The  hernia spans approximately 11 cm in craniocaudal span. The hernia  sac is positioned within the midline and right lateral anterior  abdominal wall. The hernia sac measures approximately 21 cm  transverse diameter by 7.2 cm AP diameter by 13 cm craniocaudal  span. The hernia sac contains multiple normal caliber small bowel  loops, a small segment of the sigmoid colon, mesenteric fat, and  mesenteric vessels.  It is noted that the patient's sigmoid colon is long and redundant  and is present within the right abdomen/pelvis as well as the left  aspect of the pelvis. There is no evidence of bowel obstruction,  ischemia, or inflammation.  Cholecystectomy. Postsurgical changes of gastric bypass surgery  noted. Intra-abdominal small bowel loops and colon are normal in  caliber.  There is a 2.7 cm simple cyst in the upper pole of the left kidney.  Too small to characterize 5 mm low density lesion in the lower pole  of the left kidney statistically most likely a cyst. Right kidney  is within normal limits.  The liver, spleen, adrenal glands, and pancreas are within normal  limits.  Negative for ascites or lymphadenopathy.  Abdominal aorta is normal in caliber. There  is some  atherosclerotic calcification of the right common iliac artery and  proximal right femoral artery. Atherosclerotic calcification of  branches of the internal iliac arteries bilaterally.  Urinary bladder is not very distended at the time imaging, likely  accounting for apparent bladder wall thickening. The prostate gland  is within normal limits for size. Seminal vesicles are  unremarkable. Inguinal regions appear normal.  There are bilateral pars defects at L5, with approximately 4 mm  anterolisthesis of L5 on S1.  IMPRESSION:  1. Large ventral abdominal wall hernia as described above. The  hernia sac contains multiple small bowel loops, a portion of the  sigmoid colon, mesenteric fat and vasculature.  2. No evidence of bowel obstruction.  3. Simple left renal cyst.  4. Coronary artery atherosclerosis and mild cardiomegaly.  5. Previous gastric bypass surgery and cholecystectomy.  6. Grade 1 spondylolisthesis at L5-S1.  Assessment   Incisional hernia   Plan . I recommended to him today an open ventral hernia repair with mesh. I discussed with him also the possibility of component separation. We'll long discussion about the difference being open versus laparoscopic ventral hernia repair. I think for him an open ventral hernia repair will be the least risk as well as given him the most opportunity to have a durable repair. I discussed the risks of hernia repair being but not limited to bleeding, infection, infection of the mesh requiring reoperation, wound dehiscence, need to heal by secondary intention, drain placement, ileus, DVT, PE, recurrence of his hernia. He understands all of these risks.

## 2011-09-12 NOTE — Transfer of Care (Signed)
Immediate Anesthesia Transfer of Care Note  Patient: Alexander Holloway  Procedure(s) Performed: Procedure(s) (LRB): HERNIA REPAIR VENTRAL ADULT (N/A) PANNICULECTOMY (N/A)  Patient Location: PACU  Anesthesia Type: General  Level of Consciousness: awake, alert  and oriented  Airway & Oxygen Therapy: Patient Spontanous Breathing and Patient connected to nasal cannula oxygen  Post-op Assessment: Report given to PACU RN, Post -op Vital signs reviewed and stable and Patient moving all extremities  Post vital signs: Reviewed and stable  Complications: No apparent anesthesia complications

## 2011-09-12 NOTE — Anesthesia Preprocedure Evaluation (Addendum)
Anesthesia Evaluation  Patient identified by MRN, date of birth, ID band Patient awake    Reviewed: Allergy & Precautions, H&P , NPO status , Patient's Chart, lab work & pertinent test results  Airway Mallampati: II  Neck ROM: full    Dental   Pulmonary          Cardiovascular hypertension,     Neuro/Psych    GI/Hepatic GERD-  ,(+) Hepatitis -  Endo/Other  Hypothyroidism   Renal/GU      Musculoskeletal   Abdominal   Peds  Hematology   Anesthesia Other Findings   Reproductive/Obstetrics                          Anesthesia Physical Anesthesia Plan  ASA: II  Anesthesia Plan: General   Post-op Pain Management:    Induction: Intravenous  Airway Management Planned: Oral ETT  Additional Equipment:   Intra-op Plan:   Post-operative Plan: Extubation in OR  Informed Consent: I have reviewed the patients History and Physical, chart, labs and discussed the procedure including the risks, benefits and alternatives for the proposed anesthesia with the patient or authorized representative who has indicated his/her understanding and acceptance.   Dental Advisory Given  Plan Discussed with: CRNA and Surgeon  Anesthesia Plan Comments:        Anesthesia Quick Evaluation

## 2011-09-13 ENCOUNTER — Encounter (HOSPITAL_COMMUNITY): Payer: Self-pay | Admitting: General Surgery

## 2011-09-13 DIAGNOSIS — D62 Acute posthemorrhagic anemia: Secondary | ICD-10-CM

## 2011-09-13 DIAGNOSIS — I951 Orthostatic hypotension: Secondary | ICD-10-CM

## 2011-09-13 LAB — BASIC METABOLIC PANEL
BUN: 24 mg/dL — ABNORMAL HIGH (ref 6–23)
CO2: 17 mEq/L — ABNORMAL LOW (ref 19–32)
CO2: 19 mEq/L (ref 19–32)
Calcium: 7.5 mg/dL — ABNORMAL LOW (ref 8.4–10.5)
Calcium: 7.2 mg/dL — ABNORMAL LOW (ref 8.4–10.5)
Calcium: 7.2 mg/dL — ABNORMAL LOW (ref 8.4–10.5)
Creatinine, Ser: 3.11 mg/dL — ABNORMAL HIGH (ref 0.50–1.35)
Creatinine, Ser: 3.11 mg/dL — ABNORMAL HIGH (ref 0.50–1.35)
Creatinine, Ser: 3.09 mg/dL — ABNORMAL HIGH (ref 0.50–1.35)
GFR calc Af Amer: 24 mL/min — ABNORMAL LOW (ref >90)
GFR calc non Af Amer: 21 mL/min — ABNORMAL LOW (ref >90)
GFR calc non Af Amer: 21 mL/min — ABNORMAL LOW (ref >90)
GFR calc non Af Amer: 21 mL/min — ABNORMAL LOW (ref >90)
Glucose, Bld: 144 mg/dL — ABNORMAL HIGH (ref 70–99)
Glucose, Bld: 110 mg/dL — ABNORMAL HIGH (ref 70–99)
Sodium: 136 mEq/L (ref 135–145)

## 2011-09-13 LAB — CBC
HCT: 26.5 % — ABNORMAL LOW (ref 39.0–52.0)
Hemoglobin: 8.9 g/dL — ABNORMAL LOW (ref 13.0–17.0)
Hemoglobin: 8.7 g/dL — ABNORMAL LOW (ref 13.0–17.0)
MCH: 31.5 pg (ref 26.0–34.0)
MCH: 32.2 pg (ref 26.0–34.0)
MCH: 31.4 pg (ref 26.0–34.0)
MCHC: 33.2 g/dL (ref 30.0–36.0)
MCHC: 32.8 g/dL (ref 30.0–36.0)
MCV: 98 fL (ref 78.0–100.0)
MCV: 97.1 fL (ref 78.0–100.0)
MCV: 91.8 fL (ref 78.0–100.0)
Platelets: 233 10*3/uL (ref 150–400)
Platelets: 146 10*3/uL — ABNORMAL LOW (ref 150–400)
RBC: 2.98 MIL/uL — ABNORMAL LOW (ref 4.22–5.81)
RBC: 2.76 MIL/uL — ABNORMAL LOW (ref 4.22–5.81)
RDW: 13.2 % (ref 11.5–15.5)
RDW: 12.9 % (ref 11.5–15.5)
RDW: 15.2 % (ref 11.5–15.5)
WBC: 12.7 10*3/uL — ABNORMAL HIGH (ref 4.0–10.5)

## 2011-09-13 LAB — HEMOGLOBIN AND HEMATOCRIT, BLOOD: Hemoglobin: 11.2 g/dL — ABNORMAL LOW (ref 13.0–17.0)

## 2011-09-13 LAB — PREPARE RBC (CROSSMATCH)

## 2011-09-13 MED ORDER — SODIUM CHLORIDE 0.9 % IV BOLUS (SEPSIS)
1000.0000 mL | Freq: Once | INTRAVENOUS | Status: AC
  Administered 2011-09-13: 1000 mL via INTRAVENOUS

## 2011-09-13 MED ORDER — CEFAZOLIN SODIUM 1-5 GM-% IV SOLN
1.0000 g | Freq: Three times a day (TID) | INTRAVENOUS | Status: DC
  Administered 2011-09-13 – 2011-09-14 (×2): 1 g via INTRAVENOUS
  Filled 2011-09-13 (×4): qty 50

## 2011-09-13 MED ORDER — PHENOL 1.4 % MT LIQD
1.0000 | OROMUCOSAL | Status: DC | PRN
  Administered 2011-09-13: 1 via OROMUCOSAL
  Filled 2011-09-13: qty 177

## 2011-09-13 MED ORDER — MORPHINE SULFATE (PF) 1 MG/ML IV SOLN
INTRAVENOUS | Status: AC
  Filled 2011-09-13: qty 25

## 2011-09-13 MED ORDER — SODIUM CHLORIDE 0.9 % IV BOLUS (SEPSIS)
500.0000 mL | Freq: Once | INTRAVENOUS | Status: AC
  Administered 2011-09-13: 500 mL via INTRAVENOUS

## 2011-09-13 NOTE — Progress Notes (Signed)
1 Day Post-Op  Subjective: Blood from drains overnight, he feels well, no complaints except sore throat  Objective: Vital signs in last 24 hours: Temp:  [97.5 F (36.4 C)-100.2 F (37.9 C)] 98.9 F (37.2 C) (02/14 0605) Pulse Rate:  [70-111] 77  (02/14 0605) Resp:  [18-28] 18  (02/14 0629) BP: (80-162)/(50-96) 92/68 mmHg (02/14 0605) SpO2:  [88 %-99 %] 94 % (02/14 0629) Weight:  [230 lb (104.327 kg)] 230 lb (104.327 kg) (02/13 1420) Last BM Date: 09/12/11  Intake/Output from previous day: 02/13 0701 - 02/14 0700 In: 4543.3 [I.V.:4543.3] Out: 1800 [Urine:750; Drains:950; Blood:100] Intake/Output this shift:    General appearance: no distress Resp: clear to auscultation bilaterally Cardio: RRR GI: wound with some drainage, drains with bloody output, no hematoma around surgical site, few bs  Lab Results:   [facility] 09/13/11 0635 09/12/11 2342  WBC 14.7* --  HGB 9.4* 11.2*  HCT 29.2* 33.9*  PLT 233 --    Assessment/Plan: POD #1 s/p vh repair, release 1. Cont PCA 2. Pulm toilet 3. Bloody drainage from panniculectomy drains, hct decreased with low uop, this may need to return to OR but will recheck later today and follow.  Recheck hct at noon. 4. Ice chips/sips 5. SCDS   LOS: 1 day    [facility] 09/13/2011

## 2011-09-13 NOTE — Consult Note (Signed)
Name: Alexander Holloway MRN: [id number] DOB: October 19, 1955    LOS: 1  PCCM CONSULT NOTE  History of Present Illness: 56yo male with a history of HTN, open gastric bypass and large ventral hernia.  He presented 2/13 for elective ventral hernia repair and on 2/13 had fairly complicated open incisional hernia repair.  No intraoperative complications noted.  However post op developed significant blood loss from JP drains, totaling upwards of 1L by 2/14.  Pt also developed mod hypotension with SBP 80's as well as acute renal failure and was tx to ICU where PCCM was asked to follow.   Lines / Drains: Abd JP drains x 2>>>  Cultures: none  Antibiotics: Ancef (periop) 2/13>>>2/14  Tests / Events: 2/14>>> hypotension, significant bleeding from JP drains, tx ICU    Past Medical History  Diagnosis Date  . Hypertension   . Hyperlipidemia   . Hypothyroidism   . Hepatitis     1974   . GERD (gastroesophageal reflux disease)   . Hernia     ventral   Past Surgical History  Procedure Date  . 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 Heart cath 08/2006  . Cardiac catheterization     4-5 yrs ago dr turner MED [place]   ?08/09  . Cardiovascular stress test     DR TURNER  05/2011  AT OFFICE  . Tonsillectomy     AS CHILD    Prior to Admission medications   Medication Sig Start Date End Date Taking? Authorizing Provider  aspirin EC 81 MG tablet Take 81 mg by mouth daily.   Yes Historical Provider, MD  CARDIZEM LA 300 MG 24 hr tablet 300 mg daily.  03/29/11  Yes Historical Provider, MD  Cholecalciferol (VITAMIN D PO) Take 1 tablet by mouth daily.   Yes Historical Provider, MD  cloNIDine (CATAPRES) 0.1 MG tablet 0.2 mg 2 (two) times daily.  05/14/11  Yes Historical Provider, MD  eprosartan (TEVETEN) 600 MG tablet Take 600 mg by mouth daily.  06/04/11  Yes Historical Provider, MD  levothyroxine (SYNTHROID, LEVOTHROID) 100 MCG tablet Take 100 mcg by mouth 2 (two) times daily.  05/18/11  Yes Historical Provider, MD  Multiple  Vitamin (MULITIVITAMIN WITH MINERALS) TABS Take 1 tablet by mouth daily.   Yes Historical Provider, MD  omeprazole (PRILOSEC) 20 MG capsule Take 20 mg by mouth every morning.   Yes Historical Provider, MD  psyllium (METAMUCIL) 0.52 G capsule Take 0.52 g by mouth daily.   Yes Historical Provider, MD   Allergies No Known Allergies  Family History Family History  Problem Relation Age of Onset  . Other Mother 29    circulatory  . Heart disease Father 40    Social History  reports that he has never smoked. He has never used smokeless tobacco. He reports that he drinks alcohol. He reports that he does not use illicit drugs.  Review Of Systems   Currently c/o only mild intermittent abd pain and terrible dry mouth.  Denies n/v, chest pain, SOB, dizziness. All other systems reviewed and were neg.   Vital Signs: Temp:  [97.5 F (36.4 C)-100.2 F (37.9 C)] 99.5 F (37.5 C) (02/14 0942) Pulse Rate:  [77-111] 79  (02/14 0942) Resp:  [18-20] 20  (02/14 1144) BP: (80-162)/(50-96) 103/76 mmHg (02/14 0942) SpO2:  [93 %-99 %] 93 % (02/14 1144) Weight:  [230 lb (104.327 kg)] 230 lb (104.327 kg) (02/13 1420) I/O last 3 completed shifts: In: 4543.3 [I.V.:4543.3] Out: 1800 [Urine:750; Drains:950; Blood:100]  Physical Examination:  General: chronically ill appearing male, NAD Neuro: awake and alert, MAE CV: s1s2 rrr, no m/r/g PULM: resps even non labored on Rankin, diminished bases, otherwise CTA GI: obese, soft with abd binder, surgical dsg c/d, JP x 2 with small amount serosanguinous dng Extremities:  Warm and dry, no edema    Labs and Imaging:   CBC    Component Value Date/Time   WBC 16.1* 09/13/2011 1257   RBC 2.77* 09/13/2011 1257   HGB 8.7* 09/13/2011 1257   HCT 26.5* 09/13/2011 1257   PLT 199 09/13/2011 1257   MCV 95.7 09/13/2011 1257   MCH 31.4 09/13/2011 1257   MCHC 32.8 09/13/2011 1257   RDW 12.9 09/13/2011 1257    BMET    Component Value Date/Time   NA 136 09/13/2011 0635   K  4.6 09/13/2011 0635   CL 103 09/13/2011 0635   CO2 17* 09/13/2011 0635   GLUCOSE 144* 09/13/2011 0635   BUN 22 09/13/2011 0635   CREATININE 3.11* 09/13/2011 0635   CALCIUM 7.5* 09/13/2011 0635   GFRNONAA 21* 09/13/2011 0635   GFRAA 24* 09/13/2011 0635     Lab 09/13/11 1257  INR 1.18     Assessment and Plan:  Ventral hernia - s/p extensive surgical repair with post op bleeding.  POD #1 PLAN -  Per CCS May ultimately need to return to OR to identify and control bleeding See anemia  Acute blood loss anemia - post abd surgery.  Near 1L blood from JP drains.  PLAN -  Transfusion pending  F/u cbc Closely monitor JP outpt  Hypotension - in setting post op blood loss.  SBP 95-100 now (MAP 72). Good mentation.  PLAN -  Monitor Gentle volume with fluids/ blood Pt would prefer no CVL but may ultimately need central access for CVP and IV access if cont to bleed (PICC line ordered but IV team may not want to place with renal fxn)  Acute renal failure - likely r/t hypotension/ hypovolemia (Scr 1.21 pre-op).  Still making some urine but seems to be dwindling.   Lab 09/13/11 0635  CREATININE 3.11*  PLAN -  Gentle volume F/u chem this afternoon and in am  Strict I/O     WHITEHEART,KATHRYN, NP 09/13/2011  1:34 PM Pager: [phone]  Pt seen and examined and database reviewed. I agree with above findings, assessment and plan. Clinical status and BP are much improved after IVFs and PRBCs. CCS MD considering possible re-exploration tonight. We will assist in medical care while in ICU  Billy Fischer, MD;  PCCM service; [place] [phone]

## 2011-09-13 NOTE — Progress Notes (Signed)
Feels better, vitals all normal now, starting to make urine, drains with decreased output, will cont to monitor for now, recheck cbc in am, I think he has equilibrated now from major surgery and significant blood loss.

## 2011-09-13 NOTE — Progress Notes (Signed)
Paged Dr. Luisa Hart at 0630 to notify him of pt's drain output,Foley output and blood pressure.

## 2011-09-13 NOTE — Progress Notes (Signed)
Notified Dr. Luisa Hart of pt's am BP,drain output -270 from #2, and Foley output of 50 cc's.

## 2011-09-13 NOTE — Progress Notes (Signed)
Report called to RN on 2900. Transferred to 2903 accompanied by RN, NT and family members. Alexander Holloway 09/13/2011

## 2011-09-13 NOTE — Progress Notes (Signed)
Alexander Holloway bled significantly overnight and this am.  I have seen him a couple times now and one drain is now more serous and the other is still bloody but outputs have decreased.  His hct is significantly decreased.  He is hemodynamically normal now.  His uop was decreased overnight and his creatinine is significantly increased.  I discussed options with him now.  I am transferring him to the ICU and I told him he needs transfusion now even though hct is 26.9.  I am not sure if he is continuing to bleed or if this is an equilibration of him dumping nearly a liter overnight in which case he should stabilize soon.  I discussed returning him to the operating room now but we will wait until his wife is here and also transfuse him first.  He is very hesitant to do this unless absolutely necessary.  I told him would give him the blood but if he does not respond appropriately or any other changes he needs to go back.  He understands this.  Will ask the critical care service to see him as well.  I discussed this plan with he and his wife.

## 2011-09-13 NOTE — Progress Notes (Signed)
Notified MD of 60cc drainage from #2 jp. MD will round shortly.

## 2011-09-14 ENCOUNTER — Inpatient Hospital Stay (HOSPITAL_COMMUNITY): Payer: BC Managed Care – PPO | Admitting: Anesthesiology

## 2011-09-14 ENCOUNTER — Encounter (HOSPITAL_COMMUNITY): Payer: Self-pay | Admitting: Anesthesiology

## 2011-09-14 ENCOUNTER — Encounter (HOSPITAL_COMMUNITY)
Admission: RE | Disposition: A | Discharge status: Home / Self Care | Payer: Self-pay | Source: Ambulatory Visit | Attending: General Surgery

## 2011-09-14 DIAGNOSIS — N170 Acute kidney failure with tubular necrosis: Secondary | ICD-10-CM

## 2011-09-14 LAB — BASIC METABOLIC PANEL
BUN: 29 mg/dL — ABNORMAL HIGH (ref 6–23)
Calcium: 7.1 mg/dL — ABNORMAL LOW (ref 8.4–10.5)
Chloride: 105 mEq/L (ref 96–112)
Creatinine, Ser: 3.15 mg/dL — ABNORMAL HIGH (ref 0.50–1.35)
GFR calc Af Amer: 24 mL/min — ABNORMAL LOW (ref >90)
GFR calc non Af Amer: 21 mL/min — ABNORMAL LOW (ref >90)

## 2011-09-14 LAB — TYPE AND SCREEN: Unit division: 0

## 2011-09-14 LAB — CBC
HCT: 28.6 % — ABNORMAL LOW (ref 39.0–52.0)
Hemoglobin: 9.9 g/dL — ABNORMAL LOW (ref 13.0–17.0)
MCH: 30.2 pg (ref 26.0–34.0)
MCHC: 32.9 g/dL (ref 30.0–36.0)
MCHC: 32.7 g/dL (ref 30.0–36.0)
MCV: 92 fL (ref 78.0–100.0)
Platelets: 143 10*3/uL — ABNORMAL LOW (ref 150–400)
RDW: 15.7 % — ABNORMAL HIGH (ref 11.5–15.5)
RDW: 15.5 % (ref 11.5–15.5)
WBC: 12.3 10*3/uL — ABNORMAL HIGH (ref 4.0–10.5)

## 2011-09-14 SURGERY — WOUND EXPLORATION
Anesthesia: General

## 2011-09-14 MED ORDER — 0.9 % SODIUM CHLORIDE (POUR BTL) OPTIME
TOPICAL | Status: DC | PRN
  Administered 2011-09-14: 3000 mL

## 2011-09-14 MED ORDER — HYDROMORPHONE HCL PF 1 MG/ML IJ SOLN: 0.2500 mg | INTRAMUSCULAR | Status: DC | PRN

## 2011-09-14 MED ORDER — LACTATED RINGERS IV SOLN
INTRAVENOUS | Status: DC
  Administered 2011-09-14: 14:00:00 via INTRAVENOUS

## 2011-09-14 MED ORDER — THROMBIN 20000 UNITS EX KIT
PACK | CUTANEOUS | Status: DC | PRN
  Administered 2011-09-14: 40000 [IU] via TOPICAL

## 2011-09-14 MED ORDER — ONDANSETRON HCL 4 MG/2ML IJ SOLN
INTRAMUSCULAR | Status: DC | PRN
  Administered 2011-09-14: 4 mg via INTRAVENOUS

## 2011-09-14 MED ORDER — ONDANSETRON HCL 4 MG/2ML IJ SOLN
4.0000 mg | Freq: Four times a day (QID) | INTRAMUSCULAR | Status: DC | PRN
Start: 2011-09-14 — End: 2011-09-14

## 2011-09-14 MED ORDER — CEFAZOLIN SODIUM 1-5 GM-% IV SOLN
1.0000 g | Freq: Two times a day (BID) | INTRAVENOUS | Status: DC
  Administered 2011-09-14 – 2011-09-16 (×5): 1 g via INTRAVENOUS
  Filled 2011-09-14 (×6): qty 50

## 2011-09-14 MED ORDER — MENTHOL 3 MG MT LOZG
1.0000 | LOZENGE | OROMUCOSAL | Status: DC | PRN
  Filled 2011-09-14: qty 9

## 2011-09-14 MED ORDER — HETASTARCH-ELECTROLYTES 6 % IV SOLN
INTRAVENOUS | Status: DC | PRN
  Administered 2011-09-14: 15:00:00 via INTRAVENOUS

## 2011-09-14 MED ORDER — LACTATED RINGERS IV SOLN
INTRAVENOUS | Status: DC | PRN
  Administered 2011-09-14 (×2): via INTRAVENOUS

## 2011-09-14 MED ORDER — CEFAZOLIN SODIUM 1-5 GM-% IV SOLN
INTRAVENOUS | Status: AC
  Filled 2011-09-14: qty 50

## 2011-09-14 MED ORDER — MORPHINE SULFATE (PF) 1 MG/ML IV SOLN
INTRAVENOUS | Status: AC
  Administered 2011-09-14: 1.5 mg via INTRAVENOUS
  Filled 2011-09-14: qty 25

## 2011-09-14 MED ORDER — FENTANYL CITRATE 0.05 MG/ML IJ SOLN
INTRAMUSCULAR | Status: DC | PRN
  Administered 2011-09-14 (×2): 50 ug via INTRAVENOUS
  Administered 2011-09-14: 100 ug via INTRAVENOUS
  Administered 2011-09-14: 50 ug via INTRAVENOUS

## 2011-09-14 MED ORDER — HEMOSTATIC AGENTS (NO CHARGE) OPTIME
TOPICAL | Status: DC | PRN
  Administered 2011-09-14: 1 via TOPICAL

## 2011-09-14 MED ORDER — MEPERIDINE HCL 25 MG/ML IJ SOLN: 12.5000 mg | Freq: Once | INTRAMUSCULAR | Status: DC

## 2011-09-14 MED ORDER — MIDAZOLAM HCL 5 MG/5ML IJ SOLN
INTRAMUSCULAR | Status: DC | PRN
  Administered 2011-09-14: 2 mg via INTRAVENOUS

## 2011-09-14 MED ORDER — PROPOFOL 10 MG/ML IV EMUL
INTRAVENOUS | Status: DC | PRN
  Administered 2011-09-14: 180 mg via INTRAVENOUS
  Administered 2011-09-14: 20 mg via INTRAVENOUS

## 2011-09-14 SURGICAL SUPPLY — 38 items
APPLIER CLIP 11 MED OPEN (CLIP) ×2
APR CLP MED 11 20 MLT OPN (CLIP) ×1
BINDER ABD UNIV 10 28-50 (GAUZE/BANDAGES/DRESSINGS) IMPLANT
BINDER ABDOM UNIV 10 (GAUZE/BANDAGES/DRESSINGS) ×2
CHLORAPREP W/TINT 26ML (MISCELLANEOUS) ×1 IMPLANT
CLIP APPLIE 11 MED OPEN (CLIP) IMPLANT
DRAPE UTILITY 15X26 W/TAPE STR (DRAPE) ×2 IMPLANT
DRSG PAD ABDOMINAL 8X10 ST (GAUZE/BANDAGES/DRESSINGS) ×2 IMPLANT
ELECT CAUTERY BLADE 6.4 (BLADE) ×1 IMPLANT
ELECT REM PT RETURN 9FT ADLT (ELECTROSURGICAL) ×2
ELECTRODE REM PT RTRN 9FT ADLT (ELECTROSURGICAL) IMPLANT
GAUZE SPONGE 4X4 12PLY STRL LF (GAUZE/BANDAGES/DRESSINGS) ×1 IMPLANT
GLOVE BIO SURGEON STRL SZ7 (GLOVE) ×1 IMPLANT
GLOVE BIO SURGEON STRL SZ7.5 (GLOVE) ×1 IMPLANT
GLOVE BIOGEL PI IND STRL 7.0 (GLOVE) IMPLANT
GLOVE BIOGEL PI IND STRL 7.5 (GLOVE) IMPLANT
GLOVE BIOGEL PI INDICATOR 7.0 (GLOVE) ×3
GLOVE BIOGEL PI INDICATOR 7.5 (GLOVE) ×2
GLOVE BIOGEL PI ORTHO PRO SZ7 (GLOVE) ×1
GLOVE ECLIPSE 6.5 STRL STRAW (GLOVE) ×1 IMPLANT
GLOVE PI ORTHO PRO STRL SZ7 (GLOVE) IMPLANT
GLOVE SURG SS PI 6.5 STRL IVOR (GLOVE) ×1 IMPLANT
GOWN STRL NON-REIN LRG LVL3 (GOWN DISPOSABLE) ×4 IMPLANT
HEMOSTAT SNOW SURGICEL 2X4 (HEMOSTASIS) ×1 IMPLANT
KIT BASIN OR (CUSTOM PROCEDURE TRAY) ×1 IMPLANT
KIT ROOM TURNOVER OR (KITS) ×1 IMPLANT
NS IRRIG 1000ML POUR BTL (IV SOLUTION) ×3 IMPLANT
PACK GENERAL/GYN (CUSTOM PROCEDURE TRAY) ×1 IMPLANT
SPONGE LAP 18X18 X RAY DECT (DISPOSABLE) ×3 IMPLANT
SUCTION POOLE TIP (SUCTIONS) ×1 IMPLANT
SUT MON AB 4-0 PC3 18 (SUTURE) ×1 IMPLANT
SUT SILK 3 0 (SUTURE) ×2
SUT SILK 3-0 18XBRD TIE 12 (SUTURE) IMPLANT
SUT VIC AB 2-0 SH 18 (SUTURE) ×2 IMPLANT
SUT VIC AB 3-0 SH 18 (SUTURE) ×2 IMPLANT
TAPE CLOTH SURG 6X10 WHT LF (GAUZE/BANDAGES/DRESSINGS) ×1 IMPLANT
TOWEL OR 17X24 6PK STRL BLUE (TOWEL DISPOSABLE) ×1 IMPLANT
TOWEL OR 17X26 10 PK STRL BLUE (TOWEL DISPOSABLE) ×1 IMPLANT

## 2011-09-14 NOTE — Transfer of Care (Signed)
Immediate Anesthesia Transfer of Care Note  Patient: Alexander Holloway  Procedure(s) Performed: Procedure(s) (LRB): WOUND EXPLORATION (N/A)  Patient Location: PACU  Anesthesia Type: General  Level of Consciousness: awake, alert , oriented and sedated  Airway & Oxygen Therapy: Patient Spontanous Breathing and Patient connected to face mask oxygen  Post-op Assessment: Report given to PACU RN, Post -op Vital signs reviewed and stable and Patient moving all extremities  Post vital signs: Reviewed  Complications: No apparent anesthesia complications

## 2011-09-14 NOTE — Progress Notes (Signed)
Insurance provider requesting clinical update--sent 1015am.

## 2011-09-14 NOTE — Progress Notes (Signed)
2 Days Post-Op  Subjective: He feels Ok, still with bloody output from his left sided drain, much better than yesterday  Objective: Vital signs in last 24 hours: Temp:  [98 F (36.7 C)-100.4 F (38 C)] 99 F (37.2 C) (02/15 0400) Pulse Rate:  [43-103] 86  (02/15 0700) Resp:  [11-22] 11  (02/15 0700) BP: (99-153)/(36-85) 135/73 mmHg (02/15 0700) SpO2:  [81 %-100 %] 100 % (02/15 0700) Last BM Date: 09/12/11  Intake/Output from previous day: 02/14 0701 - 02/15 0700 In: 6029.5 [I.V.:5233.3; Blood:694.2; IV Piggyback:102] Out: 1484 [Urine:870; Drains:614] Intake/Output this shift:    General appearance: no distress Resp: diminished breath sounds bibasilar Cardio: RRR GI: few bs, mild hematoma llq, dressing dry, right drain serosang, left sided bloody  Lab Results:   [facility] 09/14/11 0500 09/13/11 2141  WBC 10.8* 12.7*  HGB 9.4* 9.9*  HCT 28.6* 30.1*  PLT 143* 146*   BMET  Basename 09/13/11 2141 09/13/11 1257  NA 136 135  K 4.9 4.5  CL 105 106  CO2 19 19  GLUCOSE 110* 110*  BUN 27* 24*  CREATININE 3.09* 3.11*  CALCIUM 7.2* 7.2*   PT/INR  Basename 09/13/11 1257  LABPROT 15.2  INR 1.18    Assessment/Plan: POD #2 s/p VH repair, release, excision hernia sac 1. Cont PCA 2. Pulm toilet 3. Hemodynamically normal now after resuscitation 4. Received 2u blood yesterday, still has some bloody drainage from left side, will check at noon, if cont to decrease and drainage the same will take back to OR 5. UOP better, follow creatinine 6. Remain in ICU until decide on return to OR 7. SCDs 8. NPO for now   LOS: 2 days    [facility] 09/14/2011

## 2011-09-14 NOTE — Anesthesia Preprocedure Evaluation (Signed)
Anesthesia Evaluation  Patient identified by MRN, date of birth, ID band Patient awake    Reviewed: Allergy & Precautions, H&P , NPO status , Patient's Chart, lab work & pertinent test results  Airway Mallampati: II  Neck ROM: full    Dental   Pulmonary          Cardiovascular hypertension,     Neuro/Psych    GI/Hepatic GERD-  ,(+) Hepatitis -  Endo/Other  Hypothyroidism   Renal/GU      Musculoskeletal   Abdominal   Peds  Hematology   Anesthesia Other Findings   Reproductive/Obstetrics                           Anesthesia Physical Anesthesia Plan  ASA: II  Anesthesia Plan: General   Post-op Pain Management:    Induction: Intravenous  Airway Management Planned: LMA  Additional Equipment:   Intra-op Plan:   Post-operative Plan:   Informed Consent: I have reviewed the patients History and Physical, chart, labs and discussed the procedure including the risks, benefits and alternatives for the proposed anesthesia with the patient or authorized representative who has indicated his/her understanding and acceptance.     Plan Discussed with: CRNA and Surgeon  Anesthesia Plan Comments:         Anesthesia Quick Evaluation

## 2011-09-14 NOTE — Progress Notes (Signed)
Name: Alexander Holloway MRN: [id number] DOB: 03-08-1956    LOS: 2  PCCM CONSULT NOTE  History of Present Illness: 56yo male with a history of HTN, open gastric bypass and large ventral hernia.  He presented 2/13 for elective ventral hernia repair and on 2/13 had fairly complicated open incisional hernia repair.  No intraoperative complications noted.  However post op developed significant blood loss from JP drains, totaling upwards of 1L by 2/14.  Pt also developed mod hypotension with SBP 80's as well as acute renal failure and was tx to ICU where PCCM was asked to follow.   Lines / Drains: Abd JP drains x 2>>>  Cultures: none  Antibiotics: Ancef (periop) 2/13>>>2/14  Tests / Events: 2/14>>> hypotension, significant bleeding from JP drains, tx ICU    SUBJECTIVE/OVERNIGHT/INTERVAL On 09/14/11: rt drain no bleeding. LEft drain still actively bleeding at 25-30cc/h. Though hgb holding after 2 Un prbc and asymptomatic and not on pressors  he got tachycardic standing and has active bleeding   Lab 09/14/11 0500 09/13/11 2141 09/13/11 1257 09/13/11 0802 09/13/11 0635  HGB 9.4* 9.9* 8.7* 8.9* 9.4*       Vital Signs: Temp:  [97.8 F (36.6 C)-100.4 F (38 C)] 98.3 F (36.8 C) (02/15 1200) Pulse Rate:  [43-103] 101  (02/15 1200) Resp:  [11-22] 19  (02/15 1200) BP: (99-153)/(36-86) 140/86 mmHg (02/15 1200) SpO2:  [81 %-100 %] 97 % (02/15 1200) I/O last 3 completed shifts: In: 7572.8 [I.V.:6726.7; Blood:694.2; IV Piggyback:152] Out: 2784 [Urine:1270; Drains:1514]  Physical Examination: General: chronically ill appearing male, NAD Neuro: awake and alert, MAE CV: s1s2 rrr, no m/r/g PULM: resps even non labored on , diminished bases, otherwise CTA GI: obese, soft with abd binder, surgical dsg c/d, JP x 2 with small amount serosanguinous dng Extremities:  Warm and dry, no edema  SKin: ? Pale DEVICES: LEft Drain - blood +  Labs and Imaging:   CBC    Component Value Date/Time     WBC 10.8* 09/14/2011 0500   RBC 3.11* 09/14/2011 0500   HGB 9.4* 09/14/2011 0500   HCT 28.6* 09/14/2011 0500   PLT 143* 09/14/2011 0500   MCV 92.0 09/14/2011 0500   MCH 30.2 09/14/2011 0500   MCHC 32.9 09/14/2011 0500   RDW 15.7* 09/14/2011 0500    BMET    Component Value Date/Time   NA 135 09/14/2011 0500   K 4.8 09/14/2011 0500   CL 105 09/14/2011 0500   CO2 18* 09/14/2011 0500   GLUCOSE 108* 09/14/2011 0500   BUN 29* 09/14/2011 0500   CREATININE 3.15* 09/14/2011 0500   CALCIUM 7.1* 09/14/2011 0500   GFRNONAA 21* 09/14/2011 0500   GFRAA 24* 09/14/2011 0500     Lab 09/13/11 1257  INR 1.18    Lab 09/14/11 0500 09/13/11 2141 09/13/11 1257 09/13/11 0635  CREATININE 3.15* 3.09* 3.11* 3.11*    Lab 09/14/11 0500 09/13/11 2141 09/13/11 1257 09/13/11 0802 09/13/11 0635  HGB 9.4* 9.9* 8.7* 8.9* 9.4*     Assessment and Plan:  Ventral hernia - s/p extensive surgical repair with post op bleeding.  POD #1 PLAN -  Per CCS May ultimately need to return to OR to identify and control bleeding See anemia  Acute blood loss anemia/Acute Hemorrhage - post abd surgery.   On 09/14/11  - actively bleeding from left drain and has orthostatic tachycardia. 11:45 am CBC 09/14/11 pending. S/p 2 unit prbc 09/13/11  PLAN -  Closely monitor JP outpt D/w Dr Dwain Sarna -  likely will go to OR after assessment by Dr Dwain Sarna PRBC for low bp or hgb < 7gm% o   Acute renal failure - likely r/t hypotension/ hypovolemia (Scr 1.21 pre-op).  Making 80-100cc urine/hr  Lab 09/14/11 0500 09/13/11 2141 09/13/11 1257  CREATININE 3.15* 3.09* 3.11*  PLAN -  Hydration Recheck bmet at 20:00h  The patient is critically ill with multiple organ systems failure and requires high complexity decision making for assessment and support, frequent evaluation and titration of therapies, application of advanced monitoring technologies and extensive interpretation of multiple databases.   Critical Care Time devoted to patient care  services described in this note is  30 Minutes.  Dr. Kalman Shan, M.D., [facility].C.P Pulmonary and Critical Care Medicine Staff Physician  System [place] Pager: [phone], If no answer or between  15:00h - 7:00h: call [phone]  09/14/2011 12:22 PM

## 2011-09-14 NOTE — Anesthesia Postprocedure Evaluation (Signed)
Anesthesia Post Note  Patient: Alexander Holloway  Procedure(s) Performed: Procedure(s) (LRB): WOUND EXPLORATION (N/A)  Anesthesia type: general  Patient location: PACU  Post pain: Pain level controlled  Post assessment: Patient's Cardiovascular Status Stable  Last Vitals:  Filed Vitals:   09/14/11 1615  BP:   Pulse: 114  Temp:   Resp: 18    Post vital signs: Reviewed and stable  Level of consciousness: sedated  Complications: No apparent anesthesia complications

## 2011-09-14 NOTE — Progress Notes (Signed)
He continues to drain blood from his left sided drain at high rate.  His hct is ok now but I am concerned and plan on take him to or to explore his wound.  We discussed this today and will proceed soon.

## 2011-09-14 NOTE — Op Note (Signed)
Preoperative diagnosis: Status post ventral hernia repair with component separation, bleeding from wound Postoperative diagnosis: Subcutaneous hematoma Procedure: Wound exploration with hematoma evacuation Surgeon: Dr. Harden Mo Asst.: Dr. Gaynelle Adu Anesthesia: Gen. Estimated blood loss: Minimal Specimens: None Drains: 2 19 [place] Blake drains to subcutaneous space Complications: None Disposition of patient to recovery in stable condition Sponge needle count correct x2 at operation  Indications: This is a 56 year old male who I did a large ventral hernia repair, excision of his hernia sac, component separation with closure over mesh. Postoperatively he had some bleeding which I thought had stabilized. He was transferred transfused a couple units of blood. He still appeared to be bleeding from his drain this morning and I discussed with him going back to the operating room to explore his wound.  Procedure: After informed consent was obtained the patient was taken to the operating room. He was administered cefazolin on the floor. Sequential compression devices were the legs prior to induction. He was then placed under general anesthesia without complication. His abdomen was prepped and draped in the standard sterile surgical fashion. A surgical timeout was then performed.  His prior staples were opened as well as the numerous layers I closed previously. I removed his drains. He had a  large amount of hematoma present throughout the space. This was all evacuated. Copious irrigation was performed. I made an exhaustive search and found several things that were oozing but no one large bleeding area. I oversewed several areas as well as Bovie electocautery to numerous areas. I irrigated copiously again. There was no further bleeding was completed. His hernia repair was still intact and this was all subcutaneous in nature. I then closes with layers of 2-0 Vicryl, 3-0 Vicryl, and staples. I inserted 2  space 19 [place] Blake drains and secured these with 2-0 nylon suture. He tolerated this well and was transferred to recovery stable.

## 2011-09-14 NOTE — Preoperative (Signed)
Beta Blockers   Reason not to administer Beta Blockers:Not Applicable 

## 2011-09-15 LAB — BASIC METABOLIC PANEL
BUN: 26 mg/dL — ABNORMAL HIGH (ref 6–23)
CO2: 16 mEq/L — ABNORMAL LOW (ref 19–32)
Calcium: 7.6 mg/dL — ABNORMAL LOW (ref 8.4–10.5)
Chloride: 108 mEq/L (ref 96–112)
Creatinine, Ser: 2.67 mg/dL — ABNORMAL HIGH (ref 0.50–1.35)

## 2011-09-15 LAB — CBC
MCH: 30.1 pg (ref 26.0–34.0)
MCHC: 32.5 g/dL (ref 30.0–36.0)
MCV: 92.7 fL (ref 78.0–100.0)
Platelets: 136 10*3/uL — ABNORMAL LOW (ref 150–400)
RDW: 15.6 % — ABNORMAL HIGH (ref 11.5–15.5)
WBC: 10 10*3/uL (ref 4.0–10.5)

## 2011-09-15 MED ORDER — MORPHINE SULFATE (PF) 1 MG/ML IV SOLN
INTRAVENOUS | Status: AC
  Filled 2011-09-15: qty 25

## 2011-09-15 MED ORDER — METOPROLOL TARTRATE 1 MG/ML IV SOLN
2.5000 mg | INTRAVENOUS | Status: DC | PRN
  Administered 2011-09-15 (×2): 5 mg via INTRAVENOUS
  Filled 2011-09-15 (×4): qty 5

## 2011-09-15 NOTE — Progress Notes (Signed)
Name: Alexander Holloway MRN: [id number] DOB: Aug 24, 1955    LOS: 3  PCCM CONSULT NOTE  History of Present Illness: 56yo male with a history of HTN, open gastric bypass and large ventral hernia.  He presented 2/13 for elective ventral hernia repair and on 2/13 had fairly complicated open incisional hernia repair.  No intraoperative complications noted.  However post op developed significant blood loss from JP drains, totaling upwards of 1L by 2/14.  Pt also developed mod hypotension with SBP 80's as well as acute renal failure and was tx to ICU where PCCM was asked to follow.   Lines / Drains: Abd JP drains x 2>>>  Cultures: none  Antibiotics: Ancef (periop) 2/13>>>2/14  Tests / Events: 2/14>>> hypotension, significant bleeding from JP drains, tx ICU  2/15>back to surg for exploration for source of bleeding.     SUBJECTIVE/OVERNIGHT/INTERVAL  09/15/11>>JP drains +draining   Lab 09/15/11 0600 09/14/11 1145 09/14/11 0500 09/13/11 2141 09/13/11 1257  HGB 8.6* 9.9* 9.4* 9.9* 8.7*       Vital Signs: Temp:  [97.5 F (36.4 C)-99 F (37.2 C)] 97.5 F (36.4 C) (02/16 0324) Pulse Rate:  [80-138] 97  (02/16 0700) Resp:  [10-24] 18  (02/16 0700) BP: (119-159)/(65-109) 156/90 mmHg (02/16 0700) SpO2:  [81 %-99 %] 98 % (02/16 0700) I/O last 3 completed shifts: In: 6919 [P.O.:120; I.V.:6189; Blood:350; IV Piggyback:260] Out: 3719 [Urine:2730; Drains:864; Blood:125]  Physical Examination: General: chronically ill appearing male, NAD Neuro: awake and alert, MAE CV: s1s2 rrr, no m/r/g PULM: resps even non labored on [place], diminished bases, otherwise CTA GI: obese, soft with abd binder, surgical dsg c/d, JP x 2  serosanguinous dng Extremities:  Warm and dry, no edema  SKin: Pale DEVICES: left and right  Drain - blood +  Labs and Imaging:   CBC    Component Value Date/Time   WBC 10.0 09/15/2011 0600   RBC 2.86* 09/15/2011 0600   HGB 8.6* 09/15/2011 0600   HCT 26.5* 09/15/2011 0600   PLT 136* 09/15/2011 0600   MCV 92.7 09/15/2011 0600   MCH 30.1 09/15/2011 0600   MCHC 32.5 09/15/2011 0600   RDW 15.6* 09/15/2011 0600    BMET    Component Value Date/Time   NA 136 09/15/2011 0600   K 4.4 09/15/2011 0600   CL 108 09/15/2011 0600   CO2 16* 09/15/2011 0600   GLUCOSE 86 09/15/2011 0600   BUN 26* 09/15/2011 0600   CREATININE 2.67* 09/15/2011 0600   CALCIUM 7.6* 09/15/2011 0600   GFRNONAA 25* 09/15/2011 0600   GFRAA 29* 09/15/2011 0600     Lab 09/13/11 1257  INR 1.18    Lab 09/15/11 0600 09/14/11 0500 09/13/11 2141 09/13/11 1257 09/13/11 0635  CREATININE 2.67* 3.15* 3.09* 3.11* 3.11*    Lab 09/15/11 0600 09/14/11 1145 09/14/11 0500 09/13/11 2141 09/13/11 1257  HGB 8.6* 9.9* 9.4* 9.9* 8.7*     Assessment and Plan:  Ventral hernia - s/p extensive surgical repair with post op bleeding.  2/16>return to surg. 2/15 for exploration , no sign bleeding source +hematoma  PLAN -  Per CCS   Acute blood loss anemia/Acute Hemorrhage - post abd surgery.   On 09/15/11  - cont drainage in JP drains, hbg tr down. B/P is improved and now elevated-hx of HTN   S/p 2 unit prbc 09/13/11  PLAN -  Closely monitor JP outpt  PRBC for low bp or hgb < 7gm% o   Lab 09/15/11 0600 09/14/11 1145 09/14/11  0500  HGB 8.6* 9.9* 9.4*  HCT 26.5* 30.3* 28.6*  WBC 10.0 12.3* 10.8*  PLT 136* 155 143*    Acute renal failure - likely r/t hypotension/ hypovolemia (Scr 1.21 pre-op).   2/16: scr tr down   Lab 09/15/11 0600 09/14/11 0500 09/13/11 2141  CREATININE 2.67* 3.15* 3.09*  PLAN -  Hydration Check bmet in am     HTN:  B/p tr up , was on home b/p rx  Add As needed  Metoprolol for b/p >160    Rubye Oaks NP -C  [facility] /Critical Care  [phone]   09/15/2011 7:47 AM   Reviewed above, examined pt and agree with assessment plan.  Hb relatively stable, and hemodynamics improved.  Has good urine outpt, and creatinine trending down.  Will continue IV fluid.  Advance diet as  tolerated per CCS.  Continue IV fluid.  PRN IV push for anti-HTN meds for now, and if tolerating diet will transition to oral anti-HTN meds.  Updated family at bedside.  Coralyn Helling, MD 09/15/2011, 12:46 PM Pager:  [phone]

## 2011-09-15 NOTE — Progress Notes (Signed)
1 Day Post-Op  Subjective: Looks well.  No complaints.  In good spirits. Objective: Vital signs in last 24 hours: Temp:  [97.5 F (36.4 C)-99 F (37.2 C)] 98.8 F (37.1 C) (02/16 0800) Pulse Rate:  [80-138] 106  (02/16 0800) Resp:  [10-24] 15  (02/16 0800) BP: (119-159)/(65-109) 158/93 mmHg (02/16 0800) SpO2:  [81 %-99 %] 95 % (02/16 0800) Last BM Date: 09/12/11  Intake/Output from previous day: 02/15 0701 - 02/16 0700 In: 4969 [P.O.:120; I.V.:4689; IV Piggyback:160] Out: 2778 [Urine:2065; Drains:588; Blood:125] Intake/Output this shift: Total I/O In: 137 [I.V.:137] Out: 260 [Urine:200; Drains:60]  Incision/Wound:Soft.  No palpable hematoma.  Skin viable.  JP appear serous.   No clot in drains.   Lab Results:   Basename 09/15/11 0600 09/14/11 1145  WBC 10.0 12.3*  HGB 8.6* 9.9*  HCT 26.5* 30.3*  PLT 136* 155   BMET  Basename 09/15/11 0600 09/14/11 0500  NA 136 135  K 4.4 4.8  CL 108 105  CO2 16* 18*  GLUCOSE 86 108*  BUN 26* 29*  CREATININE 2.67* 3.15*  CALCIUM 7.6* 7.1*   PT/INR  Basename 09/13/11 1257  LABPROT 15.2  INR 1.18   ABG No results found for this basename: PHART:2,PCO2:2,PO2:2,HCO3:2 in the last 72 hours  Studies/Results: No results found.  Anti-infectives: Anti-infectives     Start     Dose/Rate Route Frequency Ordered Stop   09/14/11 1807   ceFAZolin (ANCEF) IVPB 1 g/50 mL premix        1 g 100 mL/hr over 30 Minutes Intravenous Every 12 hours 09/14/11 1241     09/13/11 2200   ceFAZolin (ANCEF) IVPB 1 g/50 mL premix  Status:  Discontinued        1 g 100 mL/hr over 30 Minutes Intravenous 3 times per day 09/13/11 2054 09/14/11 1241   09/12/11 2000   ceFAZolin (ANCEF) IVPB 2 g/50 mL premix        2 g 100 mL/hr over 30 Minutes Intravenous 3 times per day 09/12/11 1438 09/13/11 1551   09/12/11 0500   ceFAZolin (ANCEF) IVPB 2 g/50 mL premix        2 g 100 mL/hr over 30 Minutes Intravenous 60 min pre-op 09/11/11 1553 09/12/11 0845            Assessment/Plan: s/p Procedure(s) (LRB): WOUND EXPLORATION (N/A) Patient Active Problem List  Diagnoses  . Incisional hernia without mention of obstruction or gangrene  . Hernia  ARF  Cr better today. ABL anemia   Hgb down some.  Follow for now.   OOB Leave foley in for strict I O s . Leave in unit for today.  LOS: 3 days    Anuradha Chabot A. 09/15/2011

## 2011-09-16 DIAGNOSIS — D62 Acute posthemorrhagic anemia: Secondary | ICD-10-CM

## 2011-09-16 DIAGNOSIS — I951 Orthostatic hypotension: Secondary | ICD-10-CM

## 2011-09-16 DIAGNOSIS — N170 Acute kidney failure with tubular necrosis: Secondary | ICD-10-CM

## 2011-09-16 LAB — BASIC METABOLIC PANEL
BUN: 20 mg/dL (ref 6–23)
Calcium: 7.6 mg/dL — ABNORMAL LOW (ref 8.4–10.5)
Chloride: 104 mEq/L (ref 96–112)
Creatinine, Ser: 2.09 mg/dL — ABNORMAL HIGH (ref 0.50–1.35)
GFR calc Af Amer: 39 mL/min — ABNORMAL LOW (ref >90)
GFR calc non Af Amer: 34 mL/min — ABNORMAL LOW (ref >90)

## 2011-09-16 LAB — CBC
HCT: 25.1 % — ABNORMAL LOW (ref 39.0–52.0)
MCHC: 33.1 g/dL (ref 30.0–36.0)
MCV: 92.3 fL (ref 78.0–100.0)
Platelets: 137 10*3/uL — ABNORMAL LOW (ref 150–400)
RDW: 15.4 % (ref 11.5–15.5)
WBC: 7 10*3/uL (ref 4.0–10.5)

## 2011-09-16 MED ORDER — CLONIDINE HCL 0.2 MG PO TABS
0.2000 mg | ORAL_TABLET | Freq: Two times a day (BID) | ORAL | Status: DC
  Administered 2011-09-16 – 2011-09-18 (×6): 0.2 mg via ORAL
  Filled 2011-09-16 (×9): qty 1

## 2011-09-16 MED ORDER — MORPHINE SULFATE (PF) 1 MG/ML IV SOLN
INTRAVENOUS | Status: AC
  Filled 2011-09-16: qty 25

## 2011-09-16 MED ORDER — DILTIAZEM HCL ER COATED BEADS 300 MG PO TB24: 300.0000 mg | ORAL_TABLET | Freq: Every day | ORAL | Status: DC

## 2011-09-16 MED ORDER — LEVOTHYROXINE SODIUM 100 MCG PO TABS
100.0000 ug | ORAL_TABLET | Freq: Two times a day (BID) | ORAL | Status: DC
  Administered 2011-09-16 – 2011-09-18 (×6): 100 ug via ORAL
  Filled 2011-09-16 (×8): qty 1

## 2011-09-16 MED ORDER — DILTIAZEM HCL ER COATED BEADS 300 MG PO CP24
300.0000 mg | ORAL_CAPSULE | Freq: Every day | ORAL | Status: DC
  Administered 2011-09-16 – 2011-09-18 (×3): 300 mg via ORAL
  Filled 2011-09-16 (×5): qty 1

## 2011-09-16 MED ORDER — BISACODYL 10 MG RE SUPP
10.0000 mg | Freq: Once | RECTAL | Status: AC
  Administered 2011-09-16: 10 mg via RECTAL
  Filled 2011-09-16: qty 1

## 2011-09-16 NOTE — Progress Notes (Addendum)
Name: Alexander Holloway MRN: [id number] DOB: 1956/06/30    LOS: 4  PCCM CONSULT NOTE  History of Present Illness: 56yo male with a history of HTN, open gastric bypass and large ventral hernia.  He presented 2/13 for elective ventral hernia repair and on 2/13 had fairly complicated open incisional hernia repair.  No intraoperative complications noted.  However post op developed significant blood loss from JP drains, totaling upwards of 1L by 2/14.  Pt also developed mod hypotension with SBP 80's as well as acute renal failure and was tx to ICU where PCCM was asked to follow.   Lines / Drains: Abd JP drains x 2>>>  Cultures: none  Antibiotics: Ancef (periop) 2/13>>>2/14  Tests / Events: 2/14>>> hypotension, significant bleeding from JP drains, tx ICU  2/15>back to surg for exploration for source of bleeding.     SUBJECTIVE/OVERNIGHT/INTERVAL  doing well overnight  Tolerating clear liquid diet   Lab 09/16/11 0554 09/15/11 0600 09/14/11 1145 09/14/11 0500 09/13/11 2141  HGB 8.3* 8.6* 9.9* 9.4* 9.9*       Vital Signs: Temp:  [98.3 F (36.8 C)-98.7 F (37.1 C)] 98.4 F (36.9 C) (02/17 0400) Pulse Rate:  [85-107] 87  (02/17 0549) Resp:  [12-22] 17  (02/17 0800) BP: (145-171)/(91-111) 146/93 mmHg (02/17 0549) SpO2:  [91 %-98 %] 97 % (02/17 0800) I/O last 3 completed shifts: In: 5926 [P.O.:1440; I.V.:4376; IV Piggyback:110] Out: 4310 [Urine:3530; Drains:780]  Physical Examination: General: chronically ill appearing male, NAD Neuro: awake and alert, MAE CV: s1s2 rrr, no m/r/g PULM: resps even non labored on [place], diminished bases, otherwise CTA GI: obese, soft with abd binder, surgical dsg c/d, JP x 2  serosanguinous dng Extremities:  Warm and dry, no edema  SKin: Pale DEVICES: left and right  Drain - blood +  Labs and Imaging:   CBC    Component Value Date/Time   WBC 7.0 09/16/2011 0554   RBC 2.72* 09/16/2011 0554   HGB 8.3* 09/16/2011 0554   HCT 25.1* 09/16/2011 0554     PLT 137* 09/16/2011 0554   MCV 92.3 09/16/2011 0554   MCH 30.5 09/16/2011 0554   MCHC 33.1 09/16/2011 0554   RDW 15.4 09/16/2011 0554    BMET    Component Value Date/Time   NA 132* 09/16/2011 0554   K 3.8 09/16/2011 0554   CL 104 09/16/2011 0554   CO2 19 09/16/2011 0554   GLUCOSE 103* 09/16/2011 0554   BUN 20 09/16/2011 0554   CREATININE 2.09* 09/16/2011 0554   CALCIUM 7.6* 09/16/2011 0554   GFRNONAA 34* 09/16/2011 0554   GFRAA 39* 09/16/2011 0554     Lab 09/13/11 1257  INR 1.18    Lab 09/16/11 0554 09/15/11 0600 09/14/11 0500 09/13/11 2141 09/13/11 1257  CREATININE 2.09* 2.67* 3.15* 3.09* 3.11*    Lab 09/16/11 0554 09/15/11 0600 09/14/11 1145 09/14/11 0500 09/13/11 2141  HGB 8.3* 8.6* 9.9* 9.4* 9.9*     Assessment and Plan:  Ventral hernia - s/p extensive surgical repair with post op bleeding. eturn to surg. 2/15 for exploration , no sign bleeding source +hematoma   PLAN -  Per CCS   Acute blood loss anemia/Acute Hemorrhage - post abd surgery.   S/p 2 unit prbc 09/13/11 217 > hjbg stab;e  PLAN -  Closely monitor JP outpt  PRBC for low bp or hgb < 7gm% o   Lab 09/16/11 0554 09/15/11 0600 09/14/11 1145  HGB 8.3* 8.6* 9.9*  HCT 25.1* 26.5* 30.3*  WBC  7.0 10.0 12.3*  PLT 137* 136* 155    Acute renal failure - likely r/t hypotension/ hypovolemia (Scr 1.21 pre-op).   2/17: scr tr down   Lab 09/16/11 0554 09/15/11 0600 09/14/11 0500  CREATININE 2.09* 2.67* 3.15*  PLAN -  Hydration Check bmet in am     HTN:  B/p tr up    Plan:  Home meds restarted   CCM to sign off, call if needed.      Rubye Oaks NP -C  [facility] /Critical Care  [phone]   09/16/2011 9:02 AM  Reviewed above, examined pt and agree with assessment/plan.  Hemodynamics have improved, and now hypertensive>>will resume home BP regimen.  Maintaining Hb.  Renal function improving with volume.  PCCM will sign off.  If additional medical assistance needed would recommend consulting  Dr. Donette Larry who is pt's PCP.  Coralyn Helling, MD 09/16/2011, 1:59 PM Pager:  [phone]

## 2011-09-16 NOTE — Progress Notes (Signed)
2 Days Post-Op  Subjective: No flatus. No n/v. Pain ok. Cr starting to trend down.  HTN issues overnight.  Objective: Vital signs in last 24 hours: Temp:  [98.3 F (36.8 C)-98.7 F (37.1 C)] 98.4 F (36.9 C) (02/17 0400) Pulse Rate:  [85-107] 87  (02/17 0549) Resp:  [12-22] 14  (02/17 0549) BP: (145-171)/(91-111) 146/93 mmHg (02/17 0549) SpO2:  [91 %-98 %] 93 % (02/17 0549) Last BM Date: 09/12/11  Intake/Output from previous day: 02/16 0701 - 02/17 0700 In: 4200 [P.O.:1320; I.V.:2830; IV Piggyback:50] Out: 2930 [Urine:2430; Drains:500] Intake/Output this shift:    Alert, nad cta Tachy but regular Soft, nd. HypoBS. Incision c/d/i. Drains -serosang  Lab Results:   Basename 09/16/11 0554 09/15/11 0600  WBC 7.0 10.0  HGB 8.3* 8.6*  HCT 25.1* 26.5*  PLT 137* 136*   BMET  Basename 09/16/11 0554 09/15/11 0600  NA 132* 136  K 3.8 4.4  CL 104 108  CO2 19 16*  GLUCOSE 103* 86  BUN 20 26*  CREATININE 2.09* 2.67*  CALCIUM 7.6* 7.6*   PT/INR  Basename 09/13/11 1257  LABPROT 15.2  INR 1.18   ABG No results found for this basename: PHART:2,PCO2:2,PO2:2,HCO3:2 in the last 72 hours  Studies/Results: No results found.  Anti-infectives: Anti-infectives     Start     Dose/Rate Route Frequency Ordered Stop   09/14/11 1807   ceFAZolin (ANCEF) IVPB 1 g/50 mL premix  Status:  Discontinued        1 g 100 mL/hr over 30 Minutes Intravenous Every 12 hours 09/14/11 1241 09/16/11 0814   09/13/11 2200   ceFAZolin (ANCEF) IVPB 1 g/50 mL premix  Status:  Discontinued        1 g 100 mL/hr over 30 Minutes Intravenous 3 times per day 09/13/11 2054 09/14/11 1241   09/12/11 2000   ceFAZolin (ANCEF) IVPB 2 g/50 mL premix        2 g 100 mL/hr over 30 Minutes Intravenous 3 times per day 09/12/11 1438 09/13/11 1551   09/12/11 0500   ceFAZolin (ANCEF) IVPB 2 g/50 mL premix        2 g 100 mL/hr over 30 Minutes Intravenous 60 min pre-op 09/11/11 1553 09/12/11 0845           Assessment/Plan: s/p Procedure(s) (LRB): Open incisional hernia repair with mesh WOUND EXPLORATION (N/A) for postop bleeding Acute blood loss anemia - hgb stable since yesterday Acute kidney injury - Cr trending down, d/c foley. Cont IVF. Avoid nephrotoxic meds HTN- will restart home BP meds GI - stay on clears for now, dulcolax Tx out of SDU. To floor D/c ancef Hold chemical VTE prophy secondary to risk of bleeding  Mary Sella. Andrey Campanile, MD, FACS General, Bariatric, & Minimally Invasive Surgery [facility], [place]   LOS: 4 days    Alexander Holloway 09/16/2011

## 2011-09-17 LAB — BASIC METABOLIC PANEL
Calcium: 7.5 mg/dL — ABNORMAL LOW (ref 8.4–10.5)
Creatinine, Ser: 1.79 mg/dL — ABNORMAL HIGH (ref 0.50–1.35)
GFR calc Af Amer: 47 mL/min — ABNORMAL LOW (ref >90)
GFR calc non Af Amer: 41 mL/min — ABNORMAL LOW (ref >90)
Sodium: 133 mEq/L — ABNORMAL LOW (ref 135–145)

## 2011-09-17 LAB — CBC
MCV: 91.7 fL (ref 78.0–100.0)
Platelets: 140 10*3/uL — ABNORMAL LOW (ref 150–400)
RBC: 2.52 MIL/uL — ABNORMAL LOW (ref 4.22–5.81)
RDW: 15 % (ref 11.5–15.5)
WBC: 6.4 10*3/uL (ref 4.0–10.5)

## 2011-09-17 MED ORDER — FERROUS SULFATE 325 (65 FE) MG PO TABS
325.0000 mg | ORAL_TABLET | Freq: Two times a day (BID) | ORAL | Status: DC
  Administered 2011-09-17 – 2011-09-18 (×4): 325 mg via ORAL
  Filled 2011-09-17 (×8): qty 1

## 2011-09-17 MED ORDER — OXYCODONE-ACETAMINOPHEN 5-325 MG PO TABS: 1.0000 | ORAL_TABLET | ORAL | Status: DC | PRN

## 2011-09-17 MED ORDER — MORPHINE SULFATE 2 MG/ML IJ SOLN: 2.0000 mg | INTRAMUSCULAR | Status: DC | PRN

## 2011-09-17 NOTE — Progress Notes (Signed)
Patient's lower abd/supra pubic area and genitals  appears ecchymotic and edematous. Pt. Is wearing a tight abdominal binder. Pt. Urinating adequate amount. No other complaints noted.  Loosened the abdominal binder, applied ice pack to the area and elevated on a pillow. Will continue to monitor. KYoung RN

## 2011-09-17 NOTE — Progress Notes (Signed)
3 Days Post-Op  Subjective: Had bm, tol clears, pain controlled, ambulating  Objective: Vital signs in last 24 hours: Temp:  [97.8 F (36.6 C)-98.8 F (37.1 C)] 97.8 F (36.6 C) (02/18 0610) Pulse Rate:  [77-102] 81  (02/18 0610) Resp:  [17-20] 18  (02/18 0610) BP: (138-186)/(81-111) 139/96 mmHg (02/18 0610) SpO2:  [94 %-99 %] 97 % (02/18 0610) Last BM Date: 09/17/11  Intake/Output from previous day: 02/17 0701 - 02/18 0700 In: 1283 [P.O.:780; I.V.:503] Out: 1745 [Urine:1400; Drains:345] Intake/Output this shift: Total I/O In: -  Out: 155 [Drains:155]  General appearance: no distress Resp: clear to auscultation bilaterally Cardio: RRR GI: drains with serosang fluid, bs present, wound clean, moderate edema lower abdominal wall  Lab Results:   Basename 09/17/11 0540 09/16/11 0554  WBC 6.4 7.0  HGB 7.6* 8.3*  HCT 23.1* 25.1*  PLT 140* 137*   BMET  Basename 09/16/11 0554 09/15/11 0600  NA 132* 136  K 3.8 4.4  CL 104 108  CO2 19 16*  GLUCOSE 103* 86  BUN 20 26*  CREATININE 2.09* 2.67*  CALCIUM 7.6* 7.6*    Assessment/Plan: S/p VH repair, re-exploration for bleeding 1. Neuro- dc pca today, po pain meds with iv backup 2. CV- home anti-htn 3. Pulm- pulm toilet 4. GI- ileus resolving, plan fulls and adat 5. Renal- good uop, Cr better, will saline lock today and follow 6. SCDS, hold pharm proph 7. Heme- no active bleeding still  Equilibrating, no need for transfusion  LOS: 5 days    [facility] 09/17/2011

## 2011-09-18 LAB — BASIC METABOLIC PANEL
Chloride: 102 mEq/L (ref 96–112)
GFR calc Af Amer: 54 mL/min — ABNORMAL LOW (ref >90)
GFR calc non Af Amer: 46 mL/min — ABNORMAL LOW (ref >90)
Glucose, Bld: 102 mg/dL — ABNORMAL HIGH (ref 70–99)
Potassium: 3.8 mEq/L (ref 3.5–5.1)
Sodium: 132 mEq/L — ABNORMAL LOW (ref 135–145)

## 2011-09-18 LAB — CBC
HCT: 23 % — ABNORMAL LOW (ref 39.0–52.0)
Hemoglobin: 7.6 g/dL — ABNORMAL LOW (ref 13.0–17.0)
MCHC: 33 g/dL (ref 30.0–36.0)
RBC: 2.55 MIL/uL — ABNORMAL LOW (ref 4.22–5.81)
WBC: 7.1 10*3/uL (ref 4.0–10.5)

## 2011-09-18 MED ORDER — BISACODYL 5 MG PO TBEC: 10.0000 mg | DELAYED_RELEASE_TABLET | Freq: Every day | ORAL | Status: DC | PRN

## 2011-09-18 MED ORDER — DOCUSATE SODIUM 100 MG PO CAPS
100.0000 mg | ORAL_CAPSULE | Freq: Two times a day (BID) | ORAL | Status: DC
  Administered 2011-09-18: 100 mg via ORAL
  Filled 2011-09-18: qty 1

## 2011-09-18 NOTE — Discharge Summary (Signed)
Physician Discharge Summary  Patient ID: TENNIS MCKINNON MRN: [id number] DOB/AGE: 08/14/1955 56 y.o.  Admit date: 09/12/2011 Discharge date: 09/18/2011  Admission Diagnoses: Ventral hernia HTN  Discharge Diagnoses:  Hemorrhage complicating procedure  S/p VH repair ABL anemia  Discharged Condition: good  Hospital Course:  18 yom who underwent ventral hernia repair as below.  Postoperatively he had hemorrhage requiring return to the operating room for wound exploration. He was in acute renal insufficiency and did receive 2 units of PRBCs.  After this, he improved and had return of bowel function.  His creatinine has much improved.  His pain is under control.  He will be discharged today with drains in place and return next week.  His Hct is also low but does not need a transfusion as his exercise tolerance is fine and he is asymptomatic.    Consults: critical care medicine  Treatments: surgery: Ventral hernia repair with mesh, component separation, excision of hernia sac Re-exploration of subcutaneous hematoma with evacuation    Disposition: home   Medication List  As of 09/18/2011  4:18 PM   ASK your doctor about these medications         aspirin EC 81 MG tablet   Take 81 mg by mouth daily.      CARDIZEM LA 300 MG 24 hr tablet   Generic drug: diltiazem   300 mg daily.      cloNIDine 0.1 MG tablet   Commonly known as: CATAPRES   0.2 mg 2 (two) times daily.      eprosartan 600 MG tablet   Commonly known as: TEVETEN   Take 600 mg by mouth daily.      levothyroxine 100 MCG tablet   Commonly known as: SYNTHROID, LEVOTHROID   Take 100 mcg by mouth 2 (two) times daily.      METAMUCIL 0.52 G capsule   Generic drug: psyllium   Take 0.52 g by mouth daily.      mulitivitamin with minerals Tabs   Take 1 tablet by mouth daily.      omeprazole 20 MG capsule   Commonly known as: PRILOSEC   Take 20 mg by mouth every morning.      VITAMIN D PO   Take 1 tablet by mouth  daily.           Follow-up Information    Follow up with [facility], [place]. Call in 1 week.   Contact information:   [employer] [place] [address] [place] [address] [phone]          Signed: Emelia Loron 09/18/2011, 4:18 PM

## 2011-09-18 NOTE — Progress Notes (Signed)
4 Days Post-Op  Subjective: Having bm, passing flatus, tol diet, ambulating, pain well controlled  Objective: Vital signs in last 24 hours: Temp:  [97.5 F (36.4 C)-98.7 F (37.1 C)] 98.5 F (36.9 C) (02/19 0505) Pulse Rate:  [74-85] 75  (02/19 0505) Resp:  [17-20] 20  (02/19 0505) BP: (133-152)/(85-100) 152/92 mmHg (02/19 0515) SpO2:  [95 %-98 %] 95 % (02/19 0505) Last BM Date: 09/17/11  Intake/Output from previous day: 02/18 0701 - 02/19 0700 In: 575.5 [P.O.:120; I.V.:455.5] Out: 1150 [Urine:1000; Drains:150] Intake/Output this shift: Total I/O In: 415.5 [P.O.:120; I.V.:295.5] Out: 330 [Urine:250; Drains:80]  General appearance: no distress Resp: clear to auscultation bilaterally Cardio: RRR GI: incision with mild ecchymosis but no infection, jps with serosang fluid as expected, bs present, soft, appropr tender  Lab Results:   Basename 09/17/11 0540 09/16/11 0554  WBC 6.4 7.0  HGB 7.6* 8.3*  HCT 23.1* 25.1*  PLT 140* 137*   BMET  Basename 09/17/11 0540 09/16/11 0554  NA 133* 132*  K 3.6 3.8  CL 104 104  CO2 20 19  GLUCOSE 99 103*  BUN 17 20  CREATININE 1.79* 2.09*  CALCIUM 7.5* 7.6*      Assessment/Plan: POD # 6/4 vh repair, reexploration for hematoma 1. PO pain meds 2. Pulm toilet 3. Home antiHTN 4. Regular diet 5. Plan dc tomorrow if continues to improve   LOS: 6 days    [facility] 09/18/2011

## 2011-09-18 NOTE — Progress Notes (Signed)
UR of chart complete.  

## 2011-09-18 NOTE — Progress Notes (Signed)
Orthopedic Tech Progress Note Patient Details:  Alexander Holloway 08-14-1955 [id number] binder     [name] 09/18/2011, 7:05 AM

## 2011-09-19 MED ORDER — OXYCODONE-ACETAMINOPHEN 5-325 MG PO TABS: 1.0000 | ORAL_TABLET | ORAL | Status: AC | PRN

## 2011-09-19 MED ORDER — FERROUS SULFATE 325 (65 FE) MG PO TABS: 325.0000 mg | ORAL_TABLET | Freq: Two times a day (BID) | ORAL | Status: DC

## 2011-09-19 MED ORDER — DSS 100 MG PO CAPS: 100.0000 mg | ORAL_CAPSULE | Freq: Two times a day (BID) | ORAL | Status: AC

## 2011-09-19 NOTE — Progress Notes (Signed)
Discharge home. Home discharge instruction given, no questions verbalized. Alert and oriented, not in any distress.

## 2011-09-19 NOTE — Discharge Instructions (Signed)
[facility]      [facility], [place] [phone]  OPEN ABDOMINAL SURGERY: POST OP INSTRUCTIONS  Always review your discharge instruction sheet given to you by the facility where your surgery was performed.  IF YOU HAVE DISABILITY OR FAMILY LEAVE FORMS, YOU MUST BRING THEM TO THE OFFICE FOR PROCESSING.  PLEASE DO NOT GIVE THEM TO YOUR DOCTOR.  1. A prescription for pain medication may be given to you upon discharge.  Take your pain medication as prescribed, if needed.  If narcotic pain medicine is not needed, then you may take acetaminophen (Tylenol) or ibuprofen (Advil) as needed. 2. Take your usually prescribed medications unless otherwise directed. 3. If you need a refill on your pain medication, please contact your pharmacy. They will contact our office to request authorization.  Prescriptions will not be filled after 5pm or on week-ends. 4. You should follow a light diet the first few days after arrival home, such as soup and crackers, pudding, etc.unless your doctor has advised otherwise. A high-fiber, low fat diet can be resumed as tolerated.   Be sure to include lots of fluids daily.  5. Most patients will experience some swelling and bruising in the area of the incision. Ice packs will help. Swelling and bruising can take several days to resolve. Record your drain outputs twice daily and bring those to your follow up appointment. 6. It is common to experience some constipation if taking pain medication after surgery.  Increasing fluid intake and taking a stool softener will usually help or prevent this problem from occurring.  A mild laxative (Milk of Magnesia or Miralax) should be taken according to package directions if there are no bowel movements after 48 hours. 7.  You may have steri-strips (small skin tapes) in place directly over the incision.  These strips should be left on the skin for 7-10 days.  If your surgeon used skin glue on the incision, you may shower in 24 hours.  The glue  will flake off over the next 2-3 weeks.  Any sutures or staples will be removed at the office during your follow-up visit. You may find that a light gauze bandage over your incision may keep your staples from being rubbed or pulled. You may shower and replace the bandage daily. 8. ACTIVITIES:  You may resume regular (light) daily activities beginning the next day--such as daily self-care, walking, climbing stairs--gradually increasing activities as tolerated.  You may have sexual intercourse when it is comfortable.  Refrain from any heavy lifting or straining until approved by your doctor. a. You may drive when you no longer are taking prescription pain medication, you can comfortably wear a seatbelt, and you can safely maneuver your car and apply brakes b. Return to Work: ___________________________________ 9. You should see your doctor in the office for a follow-up appointment approximately two weeks after your surgery.  Make sure that you call for this appointment within a day or two after you arrive home to insure a convenient appointment time. OTHER INSTRUCTIONS:  _____________________________________________________________ _____________________________________________________________  WHEN TO CALL YOUR DOCTOR: 1. Fever over 101.0 2. Inability to urinate 3. Nausea and/or vomiting 4. Extreme swelling or bruising 5. Continued bleeding from incision. 6. Increased pain, redness, or drainage from the incision. 7. Difficulty swallowing or breathing 8. Muscle cramping or spasms. 9. Numbness or tingling in hands or feet or around lips.  The clinic staff is available to answer your questions during regular business hours.  Please don't hesitate to call and ask  to speak to one of the nurses if you have concerns.  For further questions, please visit [email or web address]

## 2011-09-20 ENCOUNTER — Encounter (INDEPENDENT_AMBULATORY_CARE_PROVIDER_SITE_OTHER): Payer: Self-pay

## 2011-09-21 ENCOUNTER — Telehealth (INDEPENDENT_AMBULATORY_CARE_PROVIDER_SITE_OTHER): Payer: Self-pay

## 2011-09-21 NOTE — Telephone Encounter (Signed)
Pt called stating he has been having bilateral feet swelling since he was in hospital. Pt states he advised hospital nurse prior to d/c and was advised to elevate feet and it should resolve. No redness,fever,color change or pain if feet. Pt states no SOB or hx of heart failure. Reviewed with Dr Dwain Sarna via phone. Per Dr Doreen Salvage order pt advised to watch feet for color changes,increased swelling, pain or fever in lower legs or feet. Pt to keep appt 3-1 unless symptoms change per Dr Dwain Sarna.

## 2011-09-24 ENCOUNTER — Telehealth (INDEPENDENT_AMBULATORY_CARE_PROVIDER_SITE_OTHER): Payer: Self-pay

## 2011-09-24 NOTE — Telephone Encounter (Signed)
Appointment was made for 2/26.

## 2011-09-24 NOTE — Telephone Encounter (Signed)
The wife called with a couple concerns about her husband.  She states there is redness at the incision where the staples are.  The is seepage from the incision that is a mixture of colors per the wife.  She can't tell if there is pus.  One of the sutures has loosened from the left drain and she thinks it has come out some.  He has no fever.  Please advise.

## 2011-09-25 ENCOUNTER — Ambulatory Visit (INDEPENDENT_AMBULATORY_CARE_PROVIDER_SITE_OTHER): Payer: BC Managed Care – PPO | Admitting: General Surgery

## 2011-09-25 ENCOUNTER — Encounter (INDEPENDENT_AMBULATORY_CARE_PROVIDER_SITE_OTHER): Payer: Self-pay | Admitting: General Surgery

## 2011-09-25 VITALS — BP 138/84 | HR 76 | Resp 18 | Ht 72.0 in | Wt 237.0 lb

## 2011-09-25 DIAGNOSIS — Z09 Encounter for follow-up examination after completed treatment for conditions other than malignant neoplasm: Secondary | ICD-10-CM

## 2011-09-25 NOTE — Progress Notes (Signed)
Subjective:     Patient ID: Alexander Holloway, male   DOB: 02/16/1956, 56 y.o.   MRN: [id number]  HPI 56 yom s/p ventral hernia repair, excision of large hernia sac, component separation complicated by hemorrhage requiring reoperation.  He was discharged home doing well.  He is now eating well, having bowel movements and ambulating well.  His wife was concerned about some redness around the staples.  Review of Systems     Objective:   Physical Exam  Abdominal: Soft.         Assessment:     S/p vh repair    Plan:     I think all is well. Will continue drains.  Will monitor his bilateral lower extremity edema.  I don't think he has a dvt now.  Return next week.

## 2011-09-28 ENCOUNTER — Encounter (INDEPENDENT_AMBULATORY_CARE_PROVIDER_SITE_OTHER): Payer: BC Managed Care – PPO | Admitting: General Surgery

## 2011-10-02 ENCOUNTER — Ambulatory Visit (INDEPENDENT_AMBULATORY_CARE_PROVIDER_SITE_OTHER): Payer: BC Managed Care – PPO | Admitting: General Surgery

## 2011-10-02 ENCOUNTER — Encounter (INDEPENDENT_AMBULATORY_CARE_PROVIDER_SITE_OTHER): Payer: Self-pay | Admitting: General Surgery

## 2011-10-02 VITALS — BP 122/88 | HR 64 | Temp 97.4°F | Resp 20 | Ht 72.0 in | Wt 229.0 lb

## 2011-10-02 DIAGNOSIS — Z09 Encounter for follow-up examination after completed treatment for conditions other than malignant neoplasm: Secondary | ICD-10-CM

## 2011-10-02 NOTE — Progress Notes (Signed)
Subjective:     Patient ID: KAEMON BARNETT, male   DOB: [birth year], 56 y.o.   MRN: [id number]  HPI This is a 56 her old male who I did a ventral hernia repair with a release on both sides combined with excision of his hernia sac. I had to take him back to the operating room for a hematoma. Since then he has done very well. He has been having normal bowel function at this point. He is moving around very well and has minimal pain. He comes back in today to see about half his drains removed. There are both still putting out greater than 30 cc per day of serous fluid.  Review of Systems     Objective:   Physical Exam  Abdominal:         Assessment:     S/p vh repair, components    Plan:        I removed most of his staples today and applied Steri-Strips. I'm going to leave these drains in and have him come back early next week to see me again. I did release him to drive today.

## 2011-10-05 ENCOUNTER — Telehealth (INDEPENDENT_AMBULATORY_CARE_PROVIDER_SITE_OTHER): Payer: Self-pay

## 2011-10-05 NOTE — Telephone Encounter (Signed)
Pt calling requesting refill pain med. Pt states he is doing better but sore at night. Pt states this will be his first refill since surgery. Per protocol hydrocodone 5/325 #30   Called to [facility] pharmacy [phone] MD line.

## 2011-10-09 ENCOUNTER — Encounter (INDEPENDENT_AMBULATORY_CARE_PROVIDER_SITE_OTHER): Payer: Self-pay | Admitting: General Surgery

## 2011-10-09 ENCOUNTER — Ambulatory Visit (INDEPENDENT_AMBULATORY_CARE_PROVIDER_SITE_OTHER): Payer: BC Managed Care – PPO | Admitting: General Surgery

## 2011-10-09 VITALS — BP 120/84 | HR 92 | Resp 20 | Ht 72.0 in | Wt 223.0 lb

## 2011-10-09 DIAGNOSIS — Z09 Encounter for follow-up examination after completed treatment for conditions other than malignant neoplasm: Secondary | ICD-10-CM

## 2011-10-09 NOTE — Progress Notes (Signed)
Subjective:     Patient ID: Alexander Holloway, male   DOB: 1955-11-22, 56 y.o.   MRN: [id number]  HPI Alexander Holloway is almost 4 weeks status post a ventral hernia repair with component separation and primary closure followed by excision of this hernia sac and soft tissue overlying that. He had to be taken back to the operating room for hematoma. Since then he did very well. He is eating and ambulating well. He has some occasional discomfort at the surgical site. He returns today to have his drains evaluated. One of them is putting out significantly less than the other and is ready for removal.  Review of Systems     Objective:   Physical Exam    wound healing well without infection, drains with serous fluid Assessment:     S/p vh repair    Plan:     I removed left sided drain today.  I will remove the second drain next week.  Remainder of staples removed today

## 2011-10-16 ENCOUNTER — Encounter (INDEPENDENT_AMBULATORY_CARE_PROVIDER_SITE_OTHER): Payer: Self-pay | Admitting: General Surgery

## 2011-10-16 ENCOUNTER — Ambulatory Visit (INDEPENDENT_AMBULATORY_CARE_PROVIDER_SITE_OTHER): Payer: BC Managed Care – PPO | Admitting: General Surgery

## 2011-10-16 VITALS — BP 110/68 | HR 66 | Temp 98.0°F | Ht 72.0 in | Wt 219.0 lb

## 2011-10-16 DIAGNOSIS — Z09 Encounter for follow-up examination after completed treatment for conditions other than malignant neoplasm: Secondary | ICD-10-CM

## 2011-10-16 NOTE — Progress Notes (Signed)
Subjective:     Patient ID: Alexander Holloway, male   DOB: 03-27-1956, 56 y.o.   MRN: [id number]  HPI This is a 56 year old male who I did a ventral hernia repair with mesh followed by component separation and excision of his hernia sac. He was taken back to the operating room for a hematoma postoperatively. Since then he has been recovering very well. He reports only some intermittent pain in his right side after doing activity right now. He returns today to have his last drain removed and this is putting out less than 30 cc of serous fluid per day now.  Review of Systems     Objective:   Physical Exam Incision healing well without infection, last drain with serous fluid    Assessment:     S/p vh repair with mesh, component separation    Plan:     I removed last drain today. Will still keep on weight restrictions for another month May shower Return to see me in one month

## 2011-11-06 ENCOUNTER — Telehealth (INDEPENDENT_AMBULATORY_CARE_PROVIDER_SITE_OTHER): Payer: Self-pay | Admitting: General Surgery

## 2011-11-06 NOTE — Telephone Encounter (Signed)
Alisha, You can add him on to this week also.   MW

## 2011-11-06 NOTE — Telephone Encounter (Signed)
Pt calling to report new onset of leaking from the incision.  His surgery was 09/12/11 to repair an incisional hernia, per his report.  Last night when he removed his binder, he noted light golden-brown drainage on it.  He covered the area with gauze.  He denies pain, odor or fever.  Pt's next appt is 11/15/11; he wants to know if he should come in sooner.  Advised to keep area covered with gauze as long as it is draining and I will advise if MD wants to see him before next appt.

## 2011-11-07 ENCOUNTER — Ambulatory Visit (INDEPENDENT_AMBULATORY_CARE_PROVIDER_SITE_OTHER): Payer: BC Managed Care – PPO | Admitting: General Surgery

## 2011-11-07 ENCOUNTER — Encounter (INDEPENDENT_AMBULATORY_CARE_PROVIDER_SITE_OTHER): Payer: Self-pay | Admitting: General Surgery

## 2011-11-07 VITALS — BP 144/82 | HR 78 | Resp 18 | Ht 71.0 in | Wt 224.2 lb

## 2011-11-07 DIAGNOSIS — Z09 Encounter for follow-up examination after completed treatment for conditions other than malignant neoplasm: Secondary | ICD-10-CM

## 2011-11-07 NOTE — Progress Notes (Signed)
Subjective:     Patient ID: Alexander Holloway, male   DOB: [birth year], 56 y.o.   MRN: [id number]  HPI This is a 56 year old male who underwent a ventral hernia repair, panniculectomy, component separation. He bled and I had to reoperate on him. He's done very well since then. His return to most of his normal activity. He had a small amount of clear drainage from his incision on Monday and comes today to have that looked at. He otherwise is eating well and having normal bowel movements. He denies any fevers. Review of Systems     Objective:   Physical Exam Healing wound with scar tissue, small amount serous drainage on bandage but no collection or any other abnormality    Assessment:     S/p vh repair    Plan:     I think just has small pocket below skin.  Will just follow for now. Return in a few weeks or sooner for any problems

## 2011-11-15 ENCOUNTER — Encounter (INDEPENDENT_AMBULATORY_CARE_PROVIDER_SITE_OTHER): Payer: BC Managed Care – PPO | Admitting: General Surgery

## 2011-11-28 ENCOUNTER — Encounter (INDEPENDENT_AMBULATORY_CARE_PROVIDER_SITE_OTHER): Payer: Self-pay | Admitting: Surgery

## 2011-11-28 ENCOUNTER — Ambulatory Visit (INDEPENDENT_AMBULATORY_CARE_PROVIDER_SITE_OTHER): Payer: BC Managed Care – PPO | Admitting: Surgery

## 2011-11-28 ENCOUNTER — Telehealth (INDEPENDENT_AMBULATORY_CARE_PROVIDER_SITE_OTHER): Payer: Self-pay | Admitting: General Surgery

## 2011-11-28 VITALS — BP 120/88 | HR 72 | Temp 97.6°F | Resp 18 | Ht 71.0 in | Wt 224.5 lb

## 2011-11-28 DIAGNOSIS — Z09 Encounter for follow-up examination after completed treatment for conditions other than malignant neoplasm: Secondary | ICD-10-CM

## 2011-11-28 NOTE — Progress Notes (Signed)
Subjective:     Patient ID: Alexander Holloway, male   DOB: [birth year], 56 y.o.   MRN: [id number]  HPI This is a patient of Dr. Dwain Sarna who had a ventral hernia repair with mesh. Bilateral mesh was used. This was in February. He has a period as been draining seroma fluid. He reports that the drainage has changed to purulence.  He denies fevers  Review of Systems     Objective:   Physical Exam There are 2 open areas on the left lower quadrant. I probed them with Q-tips and one had purulent drainage. I packed this with gauze. There was some mild irritation of the skin    Assessment:     Wound infection    Plan:     I am going to place him on Bactrim. He will wash the wound peroxide for the next 3 days. He will remove the packing tomorrow before doing this. He will see Dr. Dwain Sarna next week on his regular scheduled appointment

## 2011-11-28 NOTE — Telephone Encounter (Signed)
He needs to come to urgent office.

## 2011-11-28 NOTE — Telephone Encounter (Signed)
Pt calling to report a change in the drainage from his incision.  Today there is a new, foul odor to the drainage on the dressing.  The drainage is yellowish-brown and the wound has developed redness.  He has appt Monday, 12/03/11, but wanted physician to be aware of the changes.

## 2011-12-03 ENCOUNTER — Ambulatory Visit (INDEPENDENT_AMBULATORY_CARE_PROVIDER_SITE_OTHER): Payer: BC Managed Care – PPO | Admitting: General Surgery

## 2011-12-03 ENCOUNTER — Encounter (INDEPENDENT_AMBULATORY_CARE_PROVIDER_SITE_OTHER): Payer: Self-pay | Admitting: General Surgery

## 2011-12-03 VITALS — BP 116/68 | HR 66 | Temp 97.0°F | Resp 14 | Ht 71.0 in | Wt 223.0 lb

## 2011-12-03 DIAGNOSIS — Z09 Encounter for follow-up examination after completed treatment for conditions other than malignant neoplasm: Secondary | ICD-10-CM

## 2011-12-03 NOTE — Patient Instructions (Signed)
Remove packing on Thursday.

## 2011-12-03 NOTE — Progress Notes (Signed)
Subjective:     Patient ID: Alexander Holloway, male   DOB: 1955/10/09, 56 y.o.   MRN: [id number]  HPI 18 yom s/p ventral hernia repair who had hematoma postop requiring evacuation. He has done well until having some drainage from his incision on left side.  He was seen last week by one of my partners and had some erythema.  He was put on bactrim and has been cleaning wound.  He still has some clear drainage, no fevers, redness is gone.    Review of Systems     Objective:   Physical Exam Healed incision except for two sites on left side that are about 3-4 cm deep,  No large cavity below, no infection, no erythema    Assessment:     S/p ventral hernia repair with 2 sinuses    Plan:     I packed these today.  He will remove these Thursday and then continue dressing changes.  He will complete antibiotics. I will see in 1-2 weeks.

## 2011-12-10 ENCOUNTER — Ambulatory Visit (INDEPENDENT_AMBULATORY_CARE_PROVIDER_SITE_OTHER): Payer: BC Managed Care – PPO | Admitting: General Surgery

## 2011-12-10 ENCOUNTER — Encounter (INDEPENDENT_AMBULATORY_CARE_PROVIDER_SITE_OTHER): Payer: Self-pay | Admitting: General Surgery

## 2011-12-10 VITALS — BP 126/78 | HR 72 | Resp 16 | Ht 71.0 in | Wt 217.2 lb

## 2011-12-10 DIAGNOSIS — Z09 Encounter for follow-up examination after completed treatment for conditions other than malignant neoplasm: Secondary | ICD-10-CM

## 2011-12-10 LAB — BASIC METABOLIC PANEL
BUN: 20 mg/dL (ref 6–23)
CO2: 21 mEq/L (ref 19–32)
Glucose, Bld: 92 mg/dL (ref 70–99)
Potassium: 4.8 mEq/L (ref 3.5–5.3)

## 2011-12-10 NOTE — Progress Notes (Signed)
Subjective:     Patient ID: Alexander Holloway, male   DOB: 08/04/1955, 56 y.o.   MRN: [id number]  HPI This is a 56 year old male who underwent a ventral hernia repair with panniculectomy. He had hematoma postoperatively that I reoperated on for. Since then he's really done well. He had a couple of small holes develop on the left side of his incision that are draining clear fluid. These continue to drain he returns today without any new complaints.  Review of Systems     Objective:   Physical Exam Healing incision without infection, has two small holes that probe to about 3 cm, no infection, not healing    Assessment:     S/p ventral hernia repair with drainage    Plan:     Will send for ct now to rule out any larger seroma that will require drainage, Continue dressings

## 2011-12-11 ENCOUNTER — Ambulatory Visit
Admission: RE | Admit: 2011-12-11 | Discharge: 2011-12-11 | Disposition: A | Discharge status: Home / Self Care | Payer: BC Managed Care – PPO | Source: Ambulatory Visit | Attending: General Surgery | Admitting: General Surgery

## 2011-12-11 DIAGNOSIS — Z09 Encounter for follow-up examination after completed treatment for conditions other than malignant neoplasm: Secondary | ICD-10-CM

## 2011-12-11 MED ORDER — IOHEXOL 300 MG/ML  SOLN
125.0000 mL | Freq: Once | INTRAMUSCULAR | Status: AC | PRN
  Administered 2011-12-11: 125 mL via INTRAVENOUS

## 2011-12-12 ENCOUNTER — Telehealth (INDEPENDENT_AMBULATORY_CARE_PROVIDER_SITE_OTHER): Payer: Self-pay

## 2011-12-12 DIAGNOSIS — T888XXA Other specified complications of surgical and medical care, not elsewhere classified, initial encounter: Secondary | ICD-10-CM

## 2011-12-12 NOTE — Telephone Encounter (Signed)
Called IR lmom for Alexander Holloway to call me back about pt.

## 2011-12-13 ENCOUNTER — Telehealth (INDEPENDENT_AMBULATORY_CARE_PROVIDER_SITE_OTHER): Payer: Self-pay | Admitting: General Surgery

## 2011-12-13 ENCOUNTER — Other Ambulatory Visit: Payer: Self-pay | Admitting: Radiology

## 2011-12-13 NOTE — Telephone Encounter (Signed)
Called pt back to discuss his CT results. The pt was concerned b/c the hospital called him to give him the appt to go to [facility] for the CT aspiration on 5/21 at 11:00. I explained to the pt that DR Dwain Sarna had left me a message on his results stating there was a fluid collection showing on the CT which was going to need an aspiration. I tried calling several times to [facility] to get this scheduled and had to leave messages. [facility] returned my call late yesterday afternoon to let me know that the case would have to be reviewed by [facility] radiologist first before the appt could be scheduled and they would get back with me with the appt. Time. I was waiting to get all of this info. Solved so I would only have to make one phone call to the pt. I apologized to the pt for them calling him first without him knowing what was going on. The pt is ok and understands about the appt. For next week.

## 2011-12-17 ENCOUNTER — Encounter (HOSPITAL_COMMUNITY): Payer: Self-pay | Admitting: Pharmacy Technician

## 2011-12-18 ENCOUNTER — Ambulatory Visit (HOSPITAL_COMMUNITY)
Admission: RE | Admit: 2011-12-18 | Discharge: 2011-12-18 | Disposition: A | Discharge status: Home / Self Care | Payer: BC Managed Care – PPO | Source: Ambulatory Visit | Attending: General Surgery | Admitting: General Surgery

## 2011-12-18 DIAGNOSIS — Y834 Other reconstructive surgery as the cause of abnormal reaction of the patient, or of later complication, without mention of misadventure at the time of the procedure: Secondary | ICD-10-CM | POA: Insufficient documentation

## 2011-12-18 DIAGNOSIS — T888XXA Other specified complications of surgical and medical care, not elsewhere classified, initial encounter: Secondary | ICD-10-CM

## 2011-12-18 DIAGNOSIS — IMO0002 Reserved for concepts with insufficient information to code with codable children: Secondary | ICD-10-CM | POA: Insufficient documentation

## 2011-12-18 LAB — PROTIME-INR: INR: 1.04 (ref 0.00–1.49)

## 2011-12-18 LAB — CBC
HCT: 34.7 % — ABNORMAL LOW (ref 39.0–52.0)
Hemoglobin: 11.7 g/dL — ABNORMAL LOW (ref 13.0–17.0)
MCH: 29.6 pg (ref 26.0–34.0)
MCHC: 33.7 g/dL (ref 30.0–36.0)
RDW: 14.3 % (ref 11.5–15.5)

## 2011-12-18 LAB — APTT: aPTT: 27 seconds (ref 24–37)

## 2011-12-18 MED ORDER — MIDAZOLAM HCL 2 MG/2ML IJ SOLN
INTRAMUSCULAR | Status: AC
  Filled 2011-12-18: qty 4

## 2011-12-18 MED ORDER — MIDAZOLAM HCL 5 MG/5ML IJ SOLN
INTRAMUSCULAR | Status: AC | PRN
  Administered 2011-12-18: 1 mg via INTRAVENOUS

## 2011-12-18 MED ORDER — FENTANYL CITRATE 0.05 MG/ML IJ SOLN
INTRAMUSCULAR | Status: AC | PRN
  Administered 2011-12-18: 25 ug via INTRAVENOUS

## 2011-12-18 MED ORDER — SODIUM CHLORIDE 0.9 % IV SOLN: Freq: Once | INTRAVENOUS | Status: DC

## 2011-12-18 MED ORDER — FENTANYL CITRATE 0.05 MG/ML IJ SOLN
INTRAMUSCULAR | Status: AC
  Filled 2011-12-18: qty 4

## 2011-12-18 NOTE — Procedures (Signed)
Successful ultrasound guided aspiration of subcutaneous seroma in anterior abdomen.  Removed 10 ml of blood tinged fluid.  Will send for culture.  No immediate complication.

## 2011-12-18 NOTE — ED Notes (Signed)
Left lower abd puncture site- new dsg applied

## 2011-12-18 NOTE — H&P (Signed)
Alexander Holloway is an 56 y.o. male.   Chief Complaint: non-healing wound post op- CT reveals peri-umbilical fluid collection. HPI: Post hernia repair with non-healing wound despite multiple medical attempts with packing, etc.  By surgery. CT reveals fluid collection - possible seroma versus abscess - patient presents today for aspiration.   Past Medical History  Diagnosis Date  . Hypertension   . Hyperlipidemia   . Hypothyroidism   . Hepatitis     1974   . GERD (gastroesophageal reflux disease)   . Hernia     ventral    Past Surgical History  Procedure Date  . 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 Heart cath 08/2006  . Cardiac catheterization     4-5 yrs ago dr turner MED [place]   ?08/09  . Cardiovascular stress test     DR TURNER  05/2011  AT OFFICE  . Tonsillectomy     AS CHILD   . Ventral hernia repair 09/12/2011    Procedure: HERNIA REPAIR VENTRAL ADULT;  Surgeon: Emelia Loron, MD;  Location: [facility] OR;  Service: General;  Laterality: N/A;  open ventral hernia with mesh , Partial component separation  . Panniculectomy 09/12/2011    Procedure: PANNICULECTOMY;  Surgeon: Emelia Loron, MD;  Location: [facility] OR;  Service: General;  Laterality: N/A;  . Hernia repair 09/12/2011 and 09/14/11    ventral hernia    Family History  Problem Relation Age of Onset  . Other Mother 8    circulatory  . Heart disease Father 79   Social History:  reports that he has never smoked. He has never used smokeless tobacco. He reports that he drinks alcohol. He reports that he does not use illicit drugs.  Allergies: No Known Allergies   (Not in a hospital admission)  Results for orders placed during the hospital encounter of 12/18/11 (from the past 48 hour(s))  APTT     Status: Normal   Collection Time   12/18/11  9:57 AM      Component Value Range Comment   aPTT 27  24 - 37 (seconds)   CBC     Status: Abnormal   Collection Time   12/18/11  9:57 AM      Component Value Range Comment   WBC 6.1  4.0 -  10.5 (K/uL)    RBC 3.95 (*) 4.22 - 5.81 (MIL/uL)    Hemoglobin 11.7 (*) 13.0 - 17.0 (g/dL)    HCT 44.0 (*) 10.2 - 52.0 (%)    MCV 87.8  78.0 - 100.0 (fL)    MCH 29.6  26.0 - 34.0 (pg)    MCHC 33.7  30.0 - 36.0 (g/dL)    RDW 72.5  36.6 - 44.0 (%)    Platelets 192  150 - 400 (K/uL)   PROTIME-INR     Status: Normal   Collection Time   12/18/11  9:57 AM      Component Value Range Comment   Prothrombin Time 13.8  11.6 - 15.2 (seconds)    INR 1.04  0.00 - 1.49     Review of Systems  Constitutional: Negative for fever, chills and weight loss.  HENT: Negative.   Eyes: Negative.   Respiratory: Negative.   Cardiovascular: Negative.   Gastrointestinal: Positive for abdominal pain.  Genitourinary: Negative.   Musculoskeletal: Negative.   Skin: Negative.   Neurological: Negative.  Negative for weakness.    Blood pressure 122/73, pulse 50, temperature 96.8 F (36 C), resp. rate 18, height 5\' 11"  (1.803 m),  weight 217 lb (98.431 kg), SpO2 96.00%. Physical Exam  Constitutional: He is oriented to person, place, and time. He appears well-developed and well-nourished. No distress.  HENT:  Head: Normocephalic and atraumatic.  Neck: Normal range of motion.  Cardiovascular: Normal rate and regular rhythm.  Exam reveals no gallop and no friction rub.   No murmur heard. Respiratory: Effort normal and breath sounds normal. No respiratory distress. He has no wheezes.  GI: Soft. Bowel sounds are normal. He exhibits no distension.       Small open area to left of umbilicus with oozing at lap scope insertion site.   Neurological: He is alert and oriented to person, place, and time.  Skin: Skin is warm and dry. He is not diaphoretic.  Psychiatric: He has a normal mood and affect. His behavior is normal. Judgment and thought content normal.     Assessment/Plan CT imaging reviewed with patient.  Needle aspiration procedure and possible (unlikely) percutaneous drain placement of this fluid collection  discussed with patient in detail.  Etiology of seroma and abscess reviewed with him.  Patient aware we will decompress this area and await GS& cultures - he will follow up with surgery.  Potential complications of this procedure includes but not limited to infection, pain, recurrent collection and potential complications with moderate sedation discussed with the patient's apparent understanding.  Written consent obtained.  Labs WNL to proceed.   Wyolene Weimann D 12/18/2011, 10:50 AM

## 2011-12-18 NOTE — ED Notes (Signed)
Discharged to home with son.  Discharge instructions, adverse reactions, signs and symptoms of infection reviewed all with verbalized understanding and denial of questions.  Dressing to abd clean dry and intact

## 2011-12-18 NOTE — ED Notes (Signed)
PT DENIES ANY PAIN, TALKING WITH MD

## 2011-12-19 ENCOUNTER — Telehealth (HOSPITAL_COMMUNITY): Payer: Self-pay | Admitting: *Deleted

## 2011-12-19 NOTE — Telephone Encounter (Signed)
Post procedural follow up call.  Pt returned VM left.  Says doing very well no problems or pain.  Anxious for bx results but other than that doing well

## 2011-12-21 LAB — CULTURE, ROUTINE-ABSCESS

## 2011-12-27 ENCOUNTER — Telehealth (INDEPENDENT_AMBULATORY_CARE_PROVIDER_SITE_OTHER): Payer: Self-pay | Admitting: General Surgery

## 2011-12-27 NOTE — Telephone Encounter (Signed)
Pt calling after having fluid drained at the hospital last week.  He reports it is still draining serous fluid from the two puncture wounds used.  He is concerned about the on-going drainage and his need to constantly wear the binder, "going on for 4 months now."  He needs to travel for business and is wondering how much longer it will be draining.  Reassured him, but he wants to speak with Dr. Dwain Sarna if possible.

## 2011-12-27 NOTE — Telephone Encounter (Signed)
I need to see him again soon

## 2012-01-04 ENCOUNTER — Encounter (INDEPENDENT_AMBULATORY_CARE_PROVIDER_SITE_OTHER): Payer: Self-pay | Admitting: General Surgery

## 2012-01-04 ENCOUNTER — Ambulatory Visit (INDEPENDENT_AMBULATORY_CARE_PROVIDER_SITE_OTHER): Payer: BC Managed Care – PPO | Admitting: General Surgery

## 2012-01-04 VITALS — BP 120/84 | HR 73 | Temp 97.3°F | Resp 14 | Ht 71.0 in | Wt 223.4 lb

## 2012-01-04 DIAGNOSIS — Z09 Encounter for follow-up examination after completed treatment for conditions other than malignant neoplasm: Secondary | ICD-10-CM

## 2012-01-04 NOTE — Progress Notes (Signed)
Subjective:     Patient ID: Alexander Holloway, male   DOB: Nov 10, 1955, 56 y.o.   MRN: [id number]  HPI 50 yof s/p ventral hernia repair in past who had fluid collection underneath incision.  This was aspirated and was negative for infection.  He still has two spots in the left side of his incision that are draining although these are better now.  He has no fevers or any other complaints.    Review of Systems     Objective:   Physical Exam Healed transverse incision without infection, two < 5 mm holes in left side of incision with small amount serous drainage, there is no infection, there is larger cavity below this when probed    Assessment:     S/p ventral hernia repair, open wound    Plan:     This is well away from his mesh and is a superficial opening.  I don't know if this will heal on its own.  We discussed packing, opening up more and even going to or to clean out.  We are going to pack this daily for the next 3 weeks and then see where to go from there

## 2012-01-04 NOTE — Patient Instructions (Signed)
Do dressing changes once daily.  Take dressing out then may take a shower.  Then use 2 x 2 inch gauze moist with saline and pack with q-tip.  Place cover over this.

## 2012-01-09 ENCOUNTER — Telehealth (INDEPENDENT_AMBULATORY_CARE_PROVIDER_SITE_OTHER): Payer: Self-pay | Admitting: General Surgery

## 2012-01-09 NOTE — Telephone Encounter (Signed)
Patient called in stated that the two areas he has been packing with gauze now have a light green discharge with no odor. Patient stated he was a Alexander Holloway patient that had hernia repair back in February. Patient stated openings are about an inch wide, has been using saline solution to rinse and keeping area clean. Call placed on hold, I called Alisha who advise to page Dr. Dwain Holloway to determine if he wanted to order an antibiotic or have him seen during Friday clinic. He has been paged, but in surgery so he will not be able to return the call under procedure has been completed. Patient advised that we will call him back as soon as we have an answer. Patient agreed and stated that a message can be left on [phone].

## 2012-01-10 ENCOUNTER — Ambulatory Visit (INDEPENDENT_AMBULATORY_CARE_PROVIDER_SITE_OTHER): Payer: BC Managed Care – PPO | Admitting: General Surgery

## 2012-01-10 ENCOUNTER — Encounter (INDEPENDENT_AMBULATORY_CARE_PROVIDER_SITE_OTHER): Payer: Self-pay | Admitting: General Surgery

## 2012-01-10 VITALS — BP 126/88 | HR 60 | Temp 96.5°F | Resp 18 | Ht 71.0 in | Wt 222.0 lb

## 2012-01-10 DIAGNOSIS — S31109A Unspecified open wound of abdominal wall, unspecified quadrant without penetration into peritoneal cavity, initial encounter: Secondary | ICD-10-CM

## 2012-01-10 NOTE — Progress Notes (Signed)
Patient ID: Alexander Holloway, male   DOB: 05/21/1956, 56 y.o.   MRN: [id number]  Chief Complaint  Patient presents with  . Wound Check    HPI Alexander Holloway is a 56 y.o. male.   Wound Check   56 yom I did a panniculectomy and ventral hernia repair. This was complicated by bleeding in flaps and he was reexplored.  He did well then and has been increasing activity.  He has had two small areas on his left abdominal wound that have been drained.  These are fairly shallow cavities on exam and by ct.  He has had drainage by ir of a seroma.  These wounds are still draining and I don't think they are going to heal unless we open them up some more.  Past Medical History  Diagnosis Date  . Hypertension   . Hyperlipidemia   . Hypothyroidism   . Hepatitis     1974   . GERD (gastroesophageal reflux disease)   . Hernia     ventral    Past Surgical History  Procedure Date  . 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 Heart cath 08/2006  . Cardiac catheterization     4-5 yrs ago dr turner [place]   ?08/09  . Cardiovascular stress test     DR TURNER  05/2011  AT OFFICE  . Tonsillectomy     AS CHILD   . Ventral hernia repair 09/12/2011    Procedure: HERNIA REPAIR VENTRAL ADULT;  Surgeon: Emelia Loron, MD;  Location: [facility] OR;  Service: General;  Laterality: N/A;  open ventral hernia with mesh , Partial component separation  . Panniculectomy 09/12/2011    Procedure: PANNICULECTOMY;  Surgeon: Emelia Loron, MD;  Location: [facility] OR;  Service: General;  Laterality: N/A;  . Hernia repair 09/12/2011 and 09/14/11    ventral hernia    Family History  Problem Relation Age of Onset  . Other Mother 62    circulatory  . Heart disease Father 44    Social History History  Substance Use Topics  . Smoking status: Never Smoker   . Smokeless tobacco: Never Used  . Alcohol Use: Yes     social    No Known Allergies  Current Outpatient Prescriptions  Medication Sig Dispense Refill  . aspirin EC 81 MG tablet Take  81 mg by mouth daily.      Marland Kitchen CARDIZEM LA 300 MG 24 hr tablet Take 300 mg by mouth daily.       . cholecalciferol (VITAMIN D) 1000 UNITS tablet Take 1,000 Units by mouth daily.      . cloNIDine (CATAPRES) 0.1 MG tablet Take 0.2 mg by mouth 2 (two) times daily.       Marland Kitchen eprosartan (TEVETEN) 600 MG tablet Take 600 mg by mouth 2 (two) times daily.       . ferrous sulfate 325 (65 FE) MG tablet Take 1 tablet (325 mg total) by mouth 2 (two) times daily with a meal.  40 tablet  1  . levothyroxine (SYNTHROID, LEVOTHROID) 100 MCG tablet Take 100 mcg by mouth 2 (two) times daily.       . Multiple Vitamin (MULITIVITAMIN WITH MINERALS) TABS Take 1 tablet by mouth daily.      Marland Kitchen omeprazole (PRILOSEC) 20 MG capsule Take 20 mg by mouth every morning.      . psyllium (METAMUCIL) 0.52 G capsule Take 0.52 g by mouth daily.      . simvastatin (ZOCOR) 20 MG tablet Take  20 mg by mouth daily.         Review of Systems Review of Systems  Blood pressure 126/88, pulse 60, temperature 96.5 F (35.8 C), temperature source Temporal, resp. rate 18, height 5\' 11"  (1.803 m), weight 222 lb (100.699 kg).  Physical Exam Physical Exam  Vitals reviewed. Abdominal:        Assessment    S/p vh repair with chronic draining sinus     Plan    I don't think these are going to heal with packing.  I think there is larger cavity below and they need to be connected.  I don't think I can do in office so will plan on making larger cavity and debride in or.  Should be able to do under local mac.  We discussed this will hopefully get this to heal but will require more dressing changes and may take weeks.    Alexander Holloway 01/10/2012, 10:19 AM

## 2012-01-21 ENCOUNTER — Encounter (HOSPITAL_BASED_OUTPATIENT_CLINIC_OR_DEPARTMENT_OTHER): Payer: Self-pay | Admitting: *Deleted

## 2012-01-21 NOTE — Progress Notes (Signed)
Needs istat am surg  

## 2012-01-22 ENCOUNTER — Encounter (INDEPENDENT_AMBULATORY_CARE_PROVIDER_SITE_OTHER): Payer: BC Managed Care – PPO | Admitting: General Surgery

## 2012-01-22 NOTE — Progress Notes (Signed)
Cardiac records obtained from Dr. Mayford Knife and reviewed with Dr. Gelene Mink.  Ok to proceed with surgery as planned. Will review HGB and renal labs with ISTAT in am.

## 2012-01-23 ENCOUNTER — Telehealth (INDEPENDENT_AMBULATORY_CARE_PROVIDER_SITE_OTHER): Payer: Self-pay | Admitting: General Surgery

## 2012-01-23 ENCOUNTER — Encounter (HOSPITAL_BASED_OUTPATIENT_CLINIC_OR_DEPARTMENT_OTHER): Payer: Self-pay | Admitting: Anesthesiology

## 2012-01-23 ENCOUNTER — Encounter (HOSPITAL_BASED_OUTPATIENT_CLINIC_OR_DEPARTMENT_OTHER)
Admission: RE | Disposition: A | Discharge status: Home / Self Care | Payer: Self-pay | Source: Ambulatory Visit | Attending: General Surgery

## 2012-01-23 ENCOUNTER — Ambulatory Visit (HOSPITAL_BASED_OUTPATIENT_CLINIC_OR_DEPARTMENT_OTHER)
Admission: RE | Admit: 2012-01-23 | Discharge: 2012-01-23 | Disposition: A | Discharge status: Home / Self Care | Payer: BC Managed Care – PPO | Source: Ambulatory Visit | Attending: General Surgery | Admitting: General Surgery

## 2012-01-23 SURGERY — DEBRIDEMENT, WOUND, ABDOMEN
Anesthesia: Choice

## 2012-01-23 NOTE — Telephone Encounter (Signed)
Patient arrived for surgery today for chronic wounds but this is not draining anymore and unable to be packed. I think they are finally healing and do not need to be debrided in or.  I cancelled case and will see him 9 July.

## 2012-02-05 ENCOUNTER — Encounter (INDEPENDENT_AMBULATORY_CARE_PROVIDER_SITE_OTHER): Payer: Self-pay | Admitting: General Surgery

## 2012-02-05 ENCOUNTER — Ambulatory Visit (INDEPENDENT_AMBULATORY_CARE_PROVIDER_SITE_OTHER): Payer: BC Managed Care – PPO | Admitting: General Surgery

## 2012-02-05 VITALS — BP 166/82 | HR 78 | Resp 16 | Ht 71.0 in | Wt 224.0 lb

## 2012-02-05 DIAGNOSIS — S31109A Unspecified open wound of abdominal wall, unspecified quadrant without penetration into peritoneal cavity, initial encounter: Secondary | ICD-10-CM

## 2012-02-05 NOTE — Progress Notes (Signed)
Subjective:     Patient ID: Alexander Holloway, male   DOB: 1955/08/29, 56 y.o.   MRN: [id number]  HPI 1 yof who I did a ventral hernia repair/panniculectomy complicated by bleeding requiring reoperation.  He eventually did improve and has done well.  He had a superficial area open on the left side which was slow to heal.  We actually scheduled him for debridement but healed in interim.  This has done well since then and he comes in today with no drainage doing very well.  Review of Systems     Objective:   Physical Exam Healed wound no infection no drainage    Assessment:     S/p vh repair, healed wound    Plan:     Normal activity, return to see me as needed

## 2013-04-02 ENCOUNTER — Other Ambulatory Visit: Payer: Self-pay | Admitting: Gastroenterology

## 2013-04-10 ENCOUNTER — Other Ambulatory Visit: Payer: Self-pay | Admitting: Gastroenterology

## 2013-04-10 DIAGNOSIS — Z86010 Personal history of colonic polyps: Secondary | ICD-10-CM

## 2013-05-05 ENCOUNTER — Other Ambulatory Visit: Payer: BC Managed Care – PPO

## 2013-05-14 ENCOUNTER — Ambulatory Visit
Admission: RE | Admit: 2013-05-14 | Discharge: 2013-05-14 | Disposition: A | Discharge status: Home / Self Care | Payer: BC Managed Care – PPO | Source: Ambulatory Visit | Attending: Gastroenterology | Admitting: Gastroenterology

## 2013-05-14 DIAGNOSIS — Z86010 Personal history of colonic polyps: Secondary | ICD-10-CM

## 2013-05-17 IMAGING — CT CT ABD-PELV W/ CM
3 of 5 series · 12 of 36 positions shown, 18 images · IV contrast (READICAT/WATER & [ID] OMNI 300)
Comparison: Report from CT abdomen pelvis of May 2002 (images
no longer available.

CLINICAL DATA: History of open gastric bypass surgery.  Incisional
hernia.  Please evaluate hernia prior to repair.

CT ABDOMEN AND PELVIS WITH CONTRAST
TECHNIQUE: Multidetector CT imaging of the abdomen and pelvis was
performed following the standard protocol during bolus
administration of intravenous contrast.
Contrast: 125mL OMNIPAQUE IOHEXOL 300 MG/ML IV SOLN

[Series 3: abd/pelvis with · axial · 0.84mm/px · z∈[-398,-8]mm · 7 of 104 slices shown, 12 images]
[im 13/104  soft-tissue]
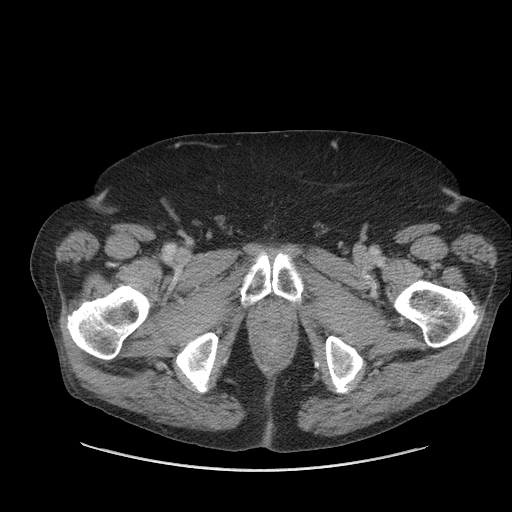
[im 13/104  bone]
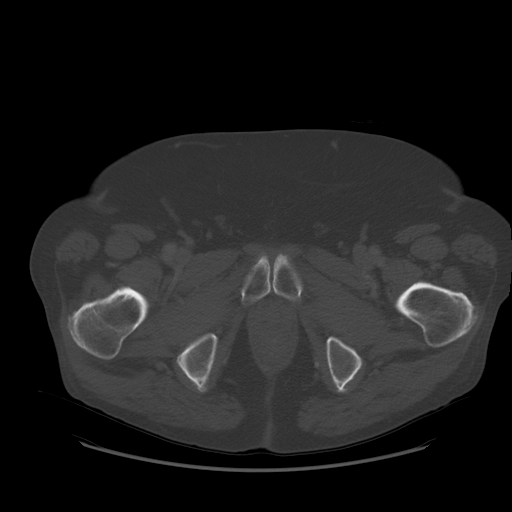
[im 26/104  soft-tissue]
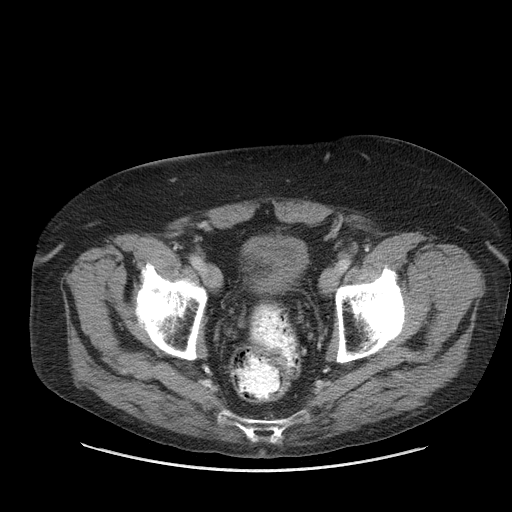
[im 39/104  soft-tissue]
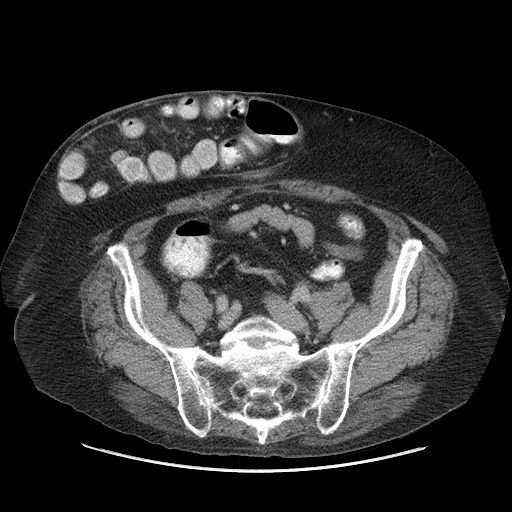
[im 52/104  soft-tissue]
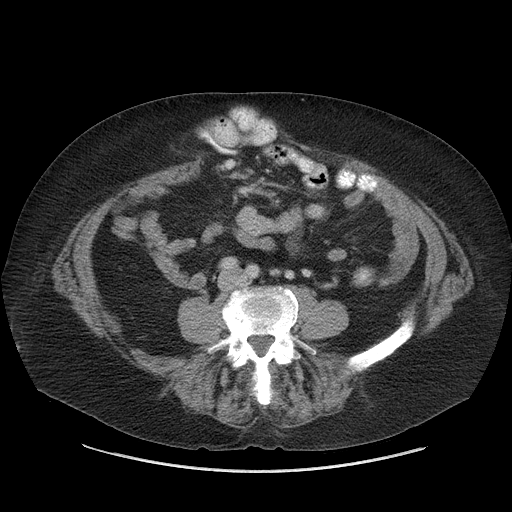
[im 52/104  lung]
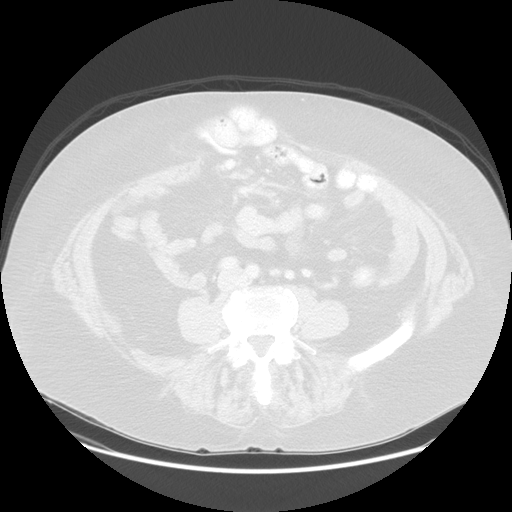
[im 65/104  soft-tissue]
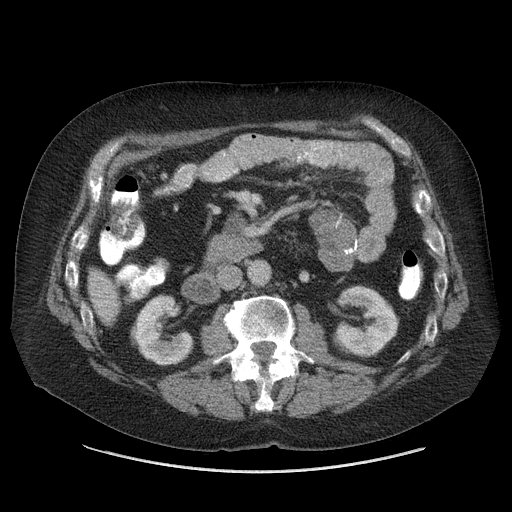
[im 65/104  lung]
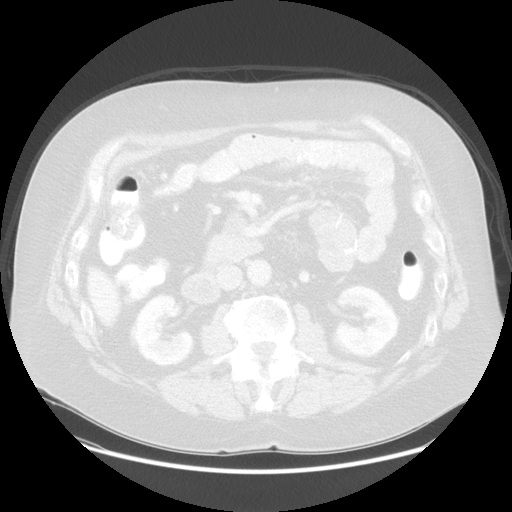
[im 78/104  soft-tissue]
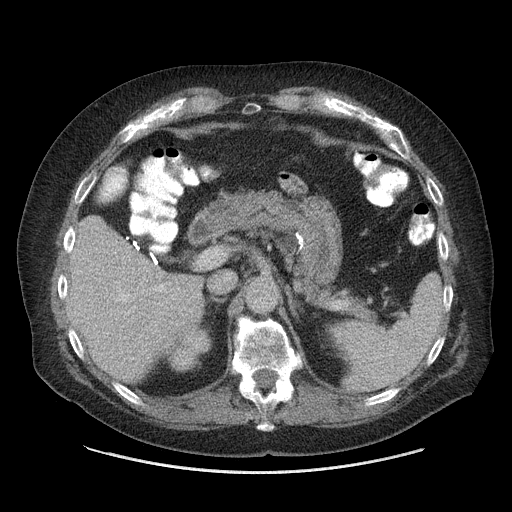
[im 78/104  lung]
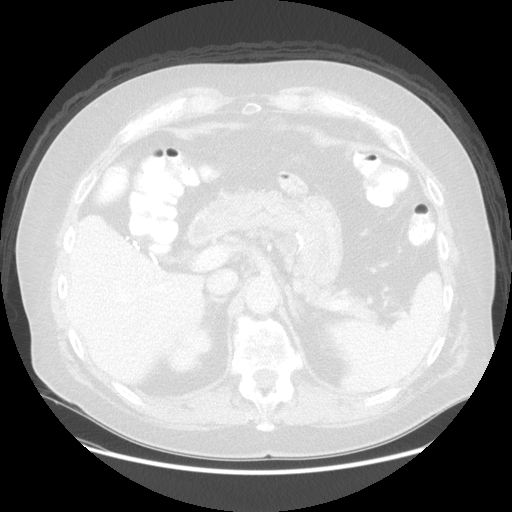
[im 91/104  soft-tissue]
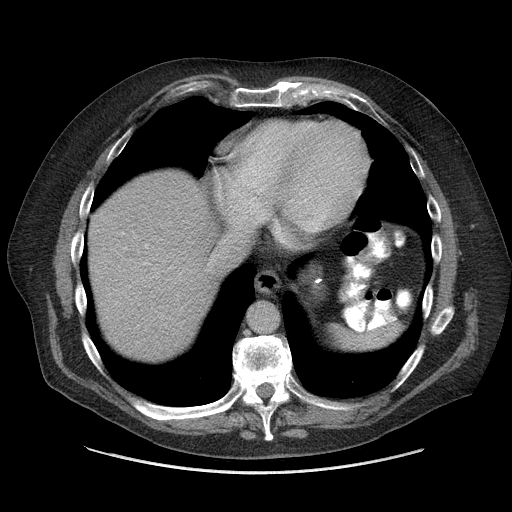
[im 91/104  lung]
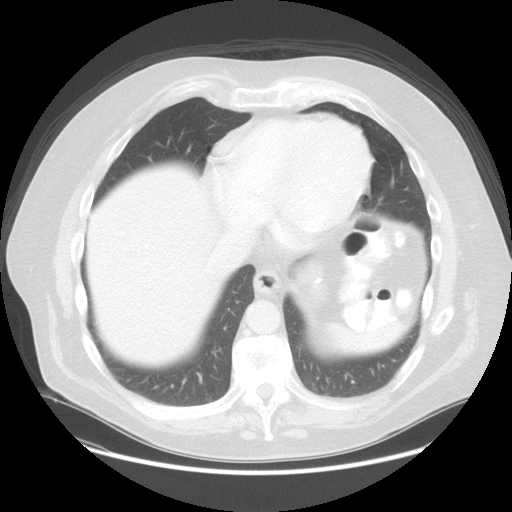

[Series 601: coronal body · coronal · 1.01mm/px · 1 of 139 slices shown, 2 images]
[im 47/139  soft-tissue]
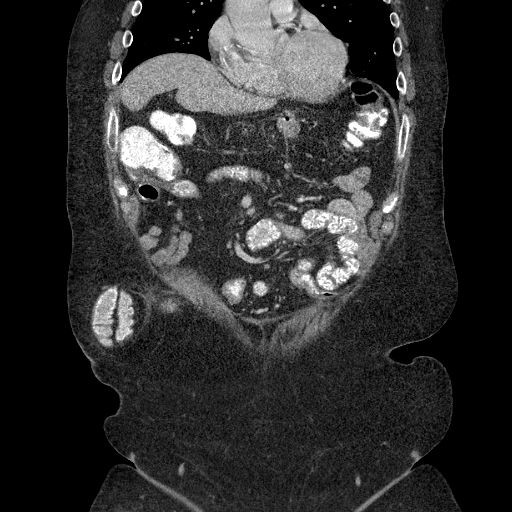
[im 47/139  bone]
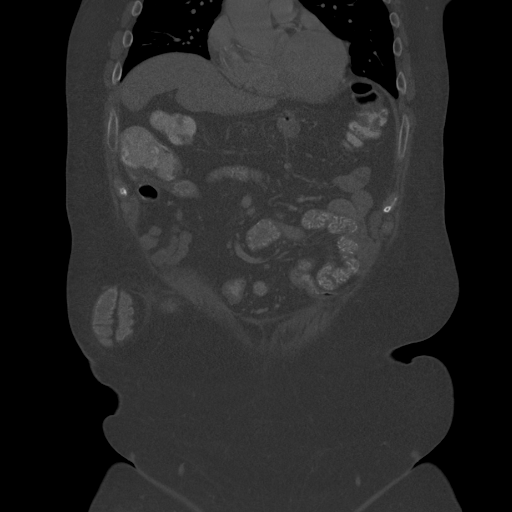

[Series 602: sagittal body · sagittal · 1.01mm/px · 4 of 173 slices shown]
[im 12/173  soft-tissue]
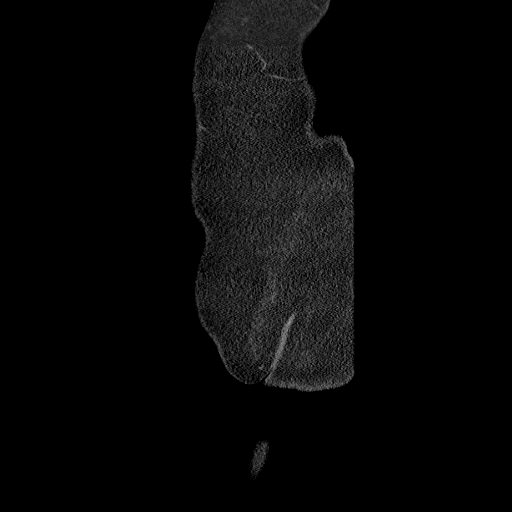
[im 35/173  soft-tissue]
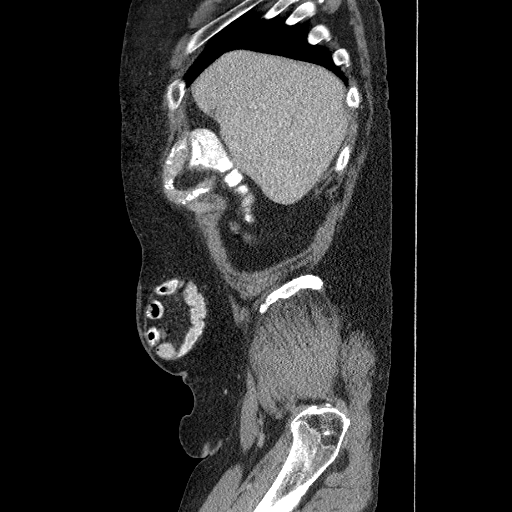
[im 58/173  soft-tissue]
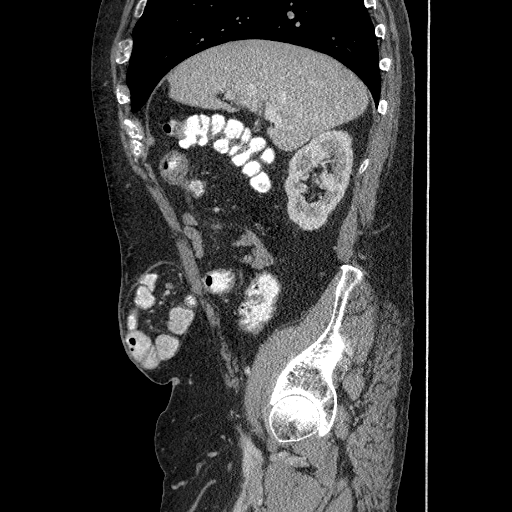
[im 81/173  soft-tissue]
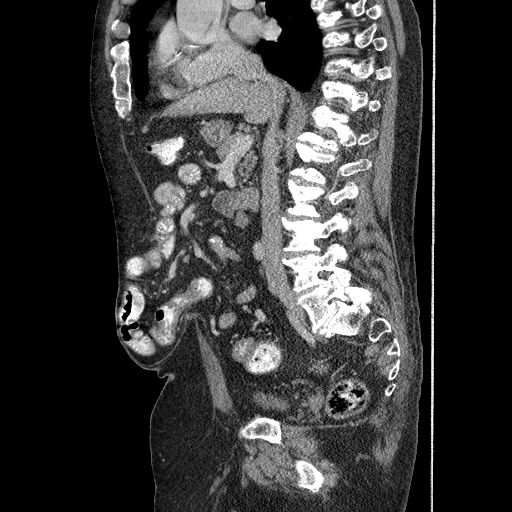

[12 of 36 positions shown; findings below may reference images not displayed]

FINDINGS: The imaged lung bases are well expanded.  Small bleb left
lower lobe.  Mild cardiomegaly with coronary artery
atherosclerosis. Mild gaseous distention of the distal esophagus.

There is a large midline ventral abdominal wall hernia.  Maximal
transverse diameter of the mouth of the hernia is 9.0 cm.  The
hernia spans approximately 11 cm in craniocaudal span.  The hernia
sac is positioned within the midline and right lateral anterior
abdominal wall.  The hernia sac measures approximately 21 cm
transverse diameter by 7.2 cm AP diameter by 13 cm craniocaudal
span.  The hernia sac contains multiple normal caliber small bowel
loops, a small segment of the sigmoid colon, mesenteric fat, and
mesenteric vessels.

It is noted that the patient's sigmoid colon is long and redundant
and is present within the right abdomen/pelvis as well as the left
aspect of the pelvis.  There is no evidence of bowel obstruction,
ischemia, or inflammation.

Cholecystectomy.  Postsurgical changes of gastric bypass surgery
noted.  Intra-abdominal small bowel loops and colon are normal in
caliber.

There is a 2.7 cm simple cyst in the upper pole of the left kidney.
Too small to characterize 5 mm low density lesion in the lower pole
of the left kidney statistically most likely a cyst.  Right kidney
is within normal limits.

The liver, spleen, adrenal glands, and pancreas are within normal
limits.

Negative for ascites or lymphadenopathy.

Abdominal aorta is normal in caliber.  There is some
atherosclerotic calcification of the right common iliac artery and
proximal right femoral artery.  Atherosclerotic calcification of
branches of the internal iliac arteries bilaterally.

Urinary bladder is not very distended at the time imaging, likely
accounting for apparent bladder wall thickening. The prostate gland
is within normal limits for size.  Seminal vesicles are
unremarkable.  Inguinal regions appear normal.

There are bilateral pars defects at L5, with approximately 4 mm
anterolisthesis of L5 on S1.
IMPRESSION: 1.  Large ventral abdominal wall hernia as described above.  The
hernia sac contains multiple small bowel loops, a portion of the
sigmoid colon, mesenteric fat and vasculature.
2.  No evidence of bowel obstruction.
3.  Simple left renal cyst.
4.  Coronary artery atherosclerosis and mild cardiomegaly.
5.  Previous gastric bypass surgery and cholecystectomy.
6.  Grade 1 spondylolisthesis at L5-S1.

## 2013-06-14 ENCOUNTER — Encounter: Payer: Self-pay | Admitting: Cardiology

## 2013-06-14 DIAGNOSIS — I10 Essential (primary) hypertension: Secondary | ICD-10-CM | POA: Insufficient documentation

## 2013-06-14 DIAGNOSIS — N182 Chronic kidney disease, stage 2 (mild): Secondary | ICD-10-CM | POA: Insufficient documentation

## 2013-06-14 DIAGNOSIS — K5790 Diverticulosis of intestine, part unspecified, without perforation or abscess without bleeding: Secondary | ICD-10-CM | POA: Insufficient documentation

## 2013-06-14 DIAGNOSIS — K219 Gastro-esophageal reflux disease without esophagitis: Secondary | ICD-10-CM | POA: Insufficient documentation

## 2013-06-14 DIAGNOSIS — E785 Hyperlipidemia, unspecified: Secondary | ICD-10-CM | POA: Insufficient documentation

## 2013-06-14 DIAGNOSIS — E039 Hypothyroidism, unspecified: Secondary | ICD-10-CM | POA: Insufficient documentation

## 2013-06-14 DIAGNOSIS — I2510 Atherosclerotic heart disease of native coronary artery without angina pectoris: Secondary | ICD-10-CM | POA: Insufficient documentation

## 2013-06-15 ENCOUNTER — Encounter: Payer: Self-pay | Admitting: Cardiology

## 2013-06-15 ENCOUNTER — Ambulatory Visit (INDEPENDENT_AMBULATORY_CARE_PROVIDER_SITE_OTHER): Payer: BC Managed Care – PPO | Admitting: Cardiology

## 2013-06-15 VITALS — BP 124/82 | HR 73 | Ht 72.0 in | Wt 222.0 lb

## 2013-06-15 DIAGNOSIS — E785 Hyperlipidemia, unspecified: Secondary | ICD-10-CM

## 2013-06-15 DIAGNOSIS — I10 Essential (primary) hypertension: Secondary | ICD-10-CM

## 2013-06-15 DIAGNOSIS — I2510 Atherosclerotic heart disease of native coronary artery without angina pectoris: Secondary | ICD-10-CM

## 2013-06-15 NOTE — Progress Notes (Signed)
[address] [place], [place]  [address] Phone: [phone] Fax:  [phone]  Date:  06/15/2013   ID:  Alexander Holloway, DOB 1955-08-14, MRN [id number]  PCP:  Georgann Housekeeper, MD  Cardiologist:  Armanda Magic, MD     History of Present Illness: Alexander Holloway is a 57 y.o. male with a history of ASCAD, HTN and lipids who presents today for followup.  He denies any chest pain, SOB, DOE, LE edema, dizziness, palpitations or syncope.   Wt Readings from Last 3 Encounters:  06/15/13 222 lb (100.699 kg)  02/05/12 224 lb (101.606 kg)  01/10/12 222 lb (100.699 kg)     Past Medical History  Diagnosis Date  . GERD (gastroesophageal reflux disease)   . Hernia     ventral  . Hepatitis     1974 -a  . Hypertension   . Hyperlipidemia   . Hypothyroidism   . Morbid obesity     s/p gastric bypass  . Diverticulosis   . Shingles   . Anxiety   . Hemorrhoids   . DJD (degenerative joint disease), cervical   . CKD (chronic kidney disease) stage 2, GFR 60-89 ml/min   . Coronary artery disease 2008    nonobstructive ASCAD    Current Outpatient Prescriptions  Medication Sig Dispense Refill  . amLODipine (NORVASC) 10 MG tablet       . CARDIZEM LA 300 MG 24 hr tablet Take 300 mg by mouth daily.       . cholecalciferol (VITAMIN D) 1000 UNITS tablet Take 1,000 Units by mouth daily.      . cloNIDine (CATAPRES) 0.1 MG tablet Take 0.2 mg by mouth 2 (two) times daily.       Marland Kitchen eprosartan (TEVETEN) 600 MG tablet Take 600 mg by mouth 2 (two) times daily.       Marland Kitchen levothyroxine (SYNTHROID, LEVOTHROID) 100 MCG tablet Take 100 mcg by mouth 2 (two) times daily.       . Multiple Vitamin (MULITIVITAMIN WITH MINERALS) TABS Take 1 tablet by mouth daily.      Marland Kitchen omeprazole (PRILOSEC) 20 MG capsule Take 20 mg by mouth every morning.      . psyllium (METAMUCIL) 0.52 G capsule Take 0.52 g by mouth daily.      . simvastatin (ZOCOR) 20 MG tablet Take 20 mg by mouth daily.        No current  facility-administered medications for this visit.    Allergies:   No Known Allergies  Social History:  The patient  reports that he has never smoked. He has never used smokeless tobacco. He reports that he drinks alcohol. He reports that he does not use illicit drugs.   Family History:  The patient's family history includes Heart disease (age of onset: 77) in his father; Other (age of onset: 93) in his mother.   ROS:  Please see the history of present illness.      All other systems reviewed and negative.   PHYSICAL EXAM: VS:  BP 124/82  Pulse 73  Ht 6' (1.829 m)  Wt 222 lb (100.699 kg)  BMI 30.10 kg/m2 Well nourished, well developed, in no acute distress HEENT: normal Neck: no JVD Cardiac:  normal S1, S2; RRR; no murmur Lungs:  clear to auscultation bilaterally, no wheezing, rhonchi or rales Abd: soft, nontender, no hepatomegaly Ext: no edema Skin: warm and dry Neuro:  CNs 2-12 intact, no focal abnormalities noted  EKG:  NSR with nonspecific T  wave abnormality  ASSESSMENT AND PLAN:  1. ASCAD with no angina  - continue ASA 2. HTN - well controlled  - continue Amlodipine/Clonidine/Teveten 3. Dyslipidemia - at goal  - continue simvastatin  Followup with me in 1 year  Signed, Armanda Magic, MD 06/15/2013 9:01 AM

## 2013-06-15 NOTE — Patient Instructions (Signed)
Your physician wants you to follow-up in: 1 YEAR with Dr Mayford Knife.  You will receive a reminder letter in the mail two months in advance. If you don't receive a letter, please call our office to schedule the follow-up appointment.  Your physician recommends that you continue on your current medications as directed. Please refer to the Current Medication list given to you today.

## 2014-04-02 ENCOUNTER — Encounter: Payer: Self-pay | Admitting: Cardiology

## 2014-06-21 ENCOUNTER — Encounter: Payer: Self-pay | Admitting: Cardiology

## 2014-06-21 ENCOUNTER — Ambulatory Visit (INDEPENDENT_AMBULATORY_CARE_PROVIDER_SITE_OTHER): Payer: Managed Care, Other (non HMO) | Admitting: Cardiology

## 2014-06-21 VITALS — BP 116/82 | HR 56 | Ht 72.0 in | Wt 200.8 lb

## 2014-06-21 DIAGNOSIS — I10 Essential (primary) hypertension: Secondary | ICD-10-CM

## 2014-06-21 DIAGNOSIS — I2510 Atherosclerotic heart disease of native coronary artery without angina pectoris: Secondary | ICD-10-CM

## 2014-06-21 DIAGNOSIS — E785 Hyperlipidemia, unspecified: Secondary | ICD-10-CM

## 2014-06-21 DIAGNOSIS — I2583 Coronary atherosclerosis due to lipid rich plaque: Principal | ICD-10-CM

## 2014-06-21 NOTE — Patient Instructions (Addendum)
Your physician recommends that you continue on your current medications as directed. Please refer to the Current Medication list given to you today.  Your physician wants you to follow-up in: 1 year with Dr. Turner. You will receive a reminder letter in the mail two months in advance. If you don't receive a letter, please call our office to schedule the follow-up appointment.  

## 2014-06-21 NOTE — Progress Notes (Signed)
[address] [place], [place]  [address] Phone: [phone] Fax:  [phone]  Date:  06/21/2014   ID:  Alexander DitchCarlos O Holloway, DOB 05/02/1956, MRN [id number]  PCP:  Georgann HousekeeperHUSAIN,KARRAR, MD  Cardiologist:  Armanda Magicraci Daran Favaro, MD    History of Present Illness: Alexander DitchCarlos O Holloway is a 58 y.o. male with a history of ASCAD, HTN and lipids who presents today for followup. He denies any chest pain, SOB, DOE, LE edema, dizziness, palpitations or syncope.  Wt Readings from Last 3 Encounters:  06/21/14 200 lb 12.8 oz (91.082 kg)  06/15/13 222 lb (100.699 kg)  02/05/12 224 lb (101.606 kg)     Past Medical History  Diagnosis Date  . GERD (gastroesophageal reflux disease)   . Hernia     ventral  . Hepatitis     1974 -a  . Hypertension   . Hyperlipidemia   . Hypothyroidism   . Morbid obesity     s/p gastric bypass  . Diverticulosis   . Shingles   . Anxiety   . Hemorrhoids   . DJD (degenerative joint disease), cervical   . CKD (chronic kidney disease) stage 2, GFR 60-89 ml/min   . Coronary artery disease 2008    nonobstructive ASCAD    Current Outpatient Prescriptions  Medication Sig Dispense Refill  . amLODipine (NORVASC) 10 MG tablet Take 10 mg by mouth daily.     Marland Kitchen. aspirin EC 81 MG tablet Take 81 mg by mouth daily.    . cholecalciferol (VITAMIN D) 1000 UNITS tablet Take 1,000 Units by mouth daily.    . cloNIDine (CATAPRES) 0.1 MG tablet Take 0.2 mg by mouth 2 (two) times daily.     Marland Kitchen. eprosartan (TEVETEN) 600 MG tablet Take 600 mg by mouth daily.     Marland Kitchen. levothyroxine (SYNTHROID, LEVOTHROID) 100 MCG tablet Take 100 mcg by mouth 2 (two) times daily.     . Multiple Vitamin (MULITIVITAMIN WITH MINERALS) TABS Take 1 tablet by mouth daily.    . psyllium (METAMUCIL) 0.52 G capsule Take 0.52 g by mouth daily.    . simvastatin (ZOCOR) 20 MG tablet Take 20 mg by mouth daily.      No current facility-administered medications for this visit.    Allergies:   No Known Allergies  Social  History:  The patient  reports that he has never smoked. He has never used smokeless tobacco. He reports that he drinks alcohol. He reports that he does not use illicit drugs.   Family History:  The patient's family history includes Heart disease (age of onset: 2847) in his father; Other (age of onset: 4354) in his mother.   ROS:  Please see the history of present illness.      All other systems reviewed and negative.   PHYSICAL EXAM: VS:  BP 116/82 mmHg  Pulse 56  Ht 6' (1.829 m)  Wt 200 lb 12.8 oz (91.082 kg)  BMI 27.23 kg/m2 Well nourished, well developed, in no acute distress HEENT: normal Neck: no JVD Cardiac:  normal S1, S2; RRR; no murmur Lungs:  clear to auscultation bilaterally, no wheezing, rhonchi or rales Abd: soft, nontender, no hepatomegaly Ext: no edema Skin: warm and dry Neuro:  CNs 2-12 intact, no focal abnormalities noted  EKG:  Sinus bradycardia at 56bpm with nonspecific T wave abnormality     ASSESSMENT AND PLAN:  1. ASCAD with no angina - continue ASA 2. HTN - well controlled - continue Amlodipine/Clonidine/Teveten 3. Dyslipidemia - at goal -  continue simvastatin  -  I congratulated him on his 20 lb weight loss  - continue exercise  - I will get a copy of his last lipids form Dr. Eula ListenHussain  Followup with me in 1 year  Signed, Armanda Magicraci Shalunda Lindh, MD [facility] 06/21/2014 8:25 AM

## 2015-03-09 ENCOUNTER — Encounter: Payer: Self-pay | Admitting: Cardiology

## 2015-06-20 NOTE — Progress Notes (Signed)
Cardiology Office Note   Date:  06/21/2015   ID:  Alexander DitchCarlos O Ozaki, DOB 07/22/1956, MRN [id number]  PCP:  Georgann HousekeeperHUSAIN,KARRAR, MD    Chief Complaint  Patient presents with  . Coronary Artery Disease  . Hypertension      History of Present Illness: Alexander Holloway is a 59 y.o. male with a history of ASCAD, HTN and lipids who presents today for followup. He denies any chest pain, SOB, DOE, LE edema, dizziness, palpitations or syncope.    Past Medical History  Diagnosis Date  . GERD (gastroesophageal reflux disease)   . Hernia     ventral  . Hepatitis     1974 -a  . Hypertension   . Hyperlipidemia   . Hypothyroidism   . Morbid obesity (HCC)     s/p gastric bypass  . Diverticulosis   . Shingles   . Anxiety   . Hemorrhoids   . DJD (degenerative joint disease), cervical   . CKD (chronic kidney disease) stage 2, GFR 60-89 ml/min   . Coronary artery disease 2008    nonobstructive ASCAD    Past Surgical History  Procedure Laterality Date  . Gastic bypass  05/1993  . Heart cath  08/2006  . Cardiac catheterization      4-5 yrs ago dr Jaxsyn Catalfamo MED [place]   ?08/09  . Cardiovascular stress test      DR Reid Nawrot  05/2011  AT OFFICE  . Tonsillectomy      AS CHILD   . Ventral hernia repair  09/12/2011    Procedure: HERNIA REPAIR VENTRAL ADULT;  Surgeon: Emelia LoronMatthew Wakefield, MD;  [facility];  Service: General;  Laterality: N/A;  open ventral hernia with mesh , Partial component separation  . Panniculectomy  09/12/2011    Procedure: PANNICULECTOMY;  Surgeon: Emelia LoronMatthew Wakefield, MD;  [facility];  Service: General;  Laterality: N/A;  . Hernia repair  09/12/2011 and 09/14/11    ventral hernia     Current Outpatient Prescriptions  Medication Sig Dispense Refill  . amLODipine (NORVASC) 10 MG tablet Take 10 mg by mouth daily.     Marland Kitchen. aspirin EC 81 MG tablet Take 81 mg by mouth daily.    . cholecalciferol (VITAMIN D) 1000 UNITS tablet Take 1,000 Units by mouth daily.     . cloNIDine (CATAPRES) 0.2 MG tablet Take 1 tablet by mouth 2 (two) times daily.    Marland Kitchen. eprosartan (TEVETEN) 600 MG tablet Take 600 mg by mouth daily.     Marland Kitchen. levothyroxine (SYNTHROID, LEVOTHROID) 100 MCG tablet Take 100 mcg by mouth daily.     . Multiple Vitamin (MULITIVITAMIN WITH MINERALS) TABS Take 1 tablet by mouth daily.    . simvastatin (ZOCOR) 20 MG tablet Take 20 mg by mouth daily.      No current facility-administered medications for this visit.    Allergies:   Review of patient's allergies indicates no known allergies.    Social History:  The patient  reports that he has never smoked. He has never used smokeless tobacco. He reports that he drinks alcohol. He reports that he does not use illicit drugs.   Family History:  The patient's family history includes Heart disease (age of onset: 6647) in his father; Other (age of onset: 5754) in his mother.    ROS:  Please see the history of present illness.   Otherwise, review of systems are positive  for nothing.   All other systems are reviewed and negative.    PHYSICAL EXAM: VS:  BP 142/80 mmHg  Pulse 52  Ht 6' (1.829 m)  Wt 92.443 kg (203 lb 12.8 oz)  BMI 27.63 kg/m2 , BMI Body mass index is 27.63 kg/(m^2). GEN: Well nourished, well developed, in no acute distress HEENT: normal Neck: no JVD, carotid bruits, or masses Cardiac: RRR; no  rubs, or gallops,no edema.  2/6 SM at LLSB Respiratory:  clear to auscultation bilaterally, normal work of breathing GI: soft, nontender, nondistended, + BS MS: no deformity or atrophy Skin: warm and dry, no rash Neuro:  Strength and sensation are intact Psych: euthymic mood, full affect   EKG:  EKG is ordered today. The ekg ordered today demonstrates sinus bradycardia at 52 bpm with nonspecific ST abnormality   Recent Labs: No results found for requested labs within last 365 days.    Lipid Panel No results found for: CHOL, TRIG, HDL, CHOLHDL, VLDL, LDLCALC, LDLDIRECT    Wt Readings from  Last 3 Encounters:  06/21/15 92.443 kg (203 lb 12.8 oz)  06/21/14 91.082 kg (200 lb 12.8 oz)  06/15/13 100.699 kg (222 lb)     ASSESSMENT AND PLAN:  1. ASCAD with no angina - continue ASA 2. HTN - well controlled - continue Amlodipine/Clonidine/Teveten 3. Dyslipidemia - continue simvastatin - I will get a copy of his last lipids form Dr. Eula Listen   4.       Heart murmur - I will get a 2D echo to assess   Current medicines are reviewed at length with the patient today.  The patient does not have concerns regarding medicines.  The following changes have been made:  no change  Labs/ tests ordered today: See above Assessment and Plan No orders of the defined types were placed in this encounter.     Disposition:   FU with me in 1 year  Signed, Quintella Reichert, MD  06/21/2015 8:27 AM    [facility] [address] [place], [place], [place]  [address] Phone: [phone]; Fax: [phone]

## 2015-06-21 ENCOUNTER — Encounter: Payer: Self-pay | Admitting: Cardiology

## 2015-06-21 ENCOUNTER — Other Ambulatory Visit: Payer: Self-pay

## 2015-06-21 ENCOUNTER — Ambulatory Visit (INDEPENDENT_AMBULATORY_CARE_PROVIDER_SITE_OTHER): Payer: Managed Care, Other (non HMO) | Admitting: Cardiology

## 2015-06-21 ENCOUNTER — Ambulatory Visit (HOSPITAL_COMMUNITY): Payer: Managed Care, Other (non HMO) | Attending: Cardiology

## 2015-06-21 VITALS — BP 142/80 | HR 52 | Ht 72.0 in | Wt 203.8 lb

## 2015-06-21 DIAGNOSIS — E785 Hyperlipidemia, unspecified: Secondary | ICD-10-CM

## 2015-06-21 DIAGNOSIS — I340 Nonrheumatic mitral (valve) insufficiency: Secondary | ICD-10-CM | POA: Insufficient documentation

## 2015-06-21 DIAGNOSIS — I10 Essential (primary) hypertension: Secondary | ICD-10-CM | POA: Diagnosis not present

## 2015-06-21 DIAGNOSIS — E039 Hypothyroidism, unspecified: Secondary | ICD-10-CM | POA: Insufficient documentation

## 2015-06-21 DIAGNOSIS — I2583 Coronary atherosclerosis due to lipid rich plaque: Principal | ICD-10-CM

## 2015-06-21 DIAGNOSIS — Z6827 Body mass index (BMI) 27.0-27.9, adult: Secondary | ICD-10-CM | POA: Insufficient documentation

## 2015-06-21 DIAGNOSIS — I2510 Atherosclerotic heart disease of native coronary artery without angina pectoris: Secondary | ICD-10-CM

## 2015-06-21 DIAGNOSIS — K219 Gastro-esophageal reflux disease without esophagitis: Secondary | ICD-10-CM | POA: Diagnosis not present

## 2015-06-21 DIAGNOSIS — I517 Cardiomegaly: Secondary | ICD-10-CM | POA: Insufficient documentation

## 2015-06-21 DIAGNOSIS — R011 Cardiac murmur, unspecified: Secondary | ICD-10-CM

## 2015-06-21 DIAGNOSIS — N182 Chronic kidney disease, stage 2 (mild): Secondary | ICD-10-CM | POA: Insufficient documentation

## 2015-06-21 DIAGNOSIS — I129 Hypertensive chronic kidney disease with stage 1 through stage 4 chronic kidney disease, or unspecified chronic kidney disease: Secondary | ICD-10-CM | POA: Insufficient documentation

## 2015-06-21 DIAGNOSIS — I071 Rheumatic tricuspid insufficiency: Secondary | ICD-10-CM | POA: Insufficient documentation

## 2015-06-21 DIAGNOSIS — I5189 Other ill-defined heart diseases: Secondary | ICD-10-CM | POA: Diagnosis not present

## 2015-06-21 NOTE — Patient Instructions (Signed)
Medication Instructions:  Your physician recommends that you continue on your current medications as directed. Please refer to the Current Medication list given to you today.   Labwork: None  Testing/Procedures: Your physician has requested that you have an echocardiogram AS SOON AS POSSIBLE. Echocardiography is a painless test that uses sound waves to create images of your heart. It provides your doctor with information about the size and shape of your heart and how well your heart's chambers and valves are working. This procedure takes approximately one hour. There are no restrictions for this procedure.  Follow-Up: Your physician wants you to follow-up in: 1 year with Dr. Mayford Knifeurner. You will receive a reminder letter in the mail two months in advance. If you don't receive a letter, please call our office to schedule the follow-up appointment.   Any Other Special Instructions Will Be Listed Below (If Applicable).     If you need a refill on your cardiac medications before your next appointment, please call your pharmacy.

## 2015-06-29 ENCOUNTER — Encounter: Payer: Self-pay | Admitting: Cardiology

## 2015-07-26 ENCOUNTER — Ambulatory Visit
Admission: RE | Admit: 2015-07-26 | Discharge: 2015-07-26 | Disposition: A | Discharge status: Home / Self Care | Payer: Managed Care, Other (non HMO) | Source: Ambulatory Visit | Attending: Internal Medicine | Admitting: Internal Medicine

## 2015-07-26 ENCOUNTER — Other Ambulatory Visit: Payer: Self-pay | Admitting: Internal Medicine

## 2015-07-26 DIAGNOSIS — M7989 Other specified soft tissue disorders: Secondary | ICD-10-CM

## 2015-07-26 DIAGNOSIS — R52 Pain, unspecified: Secondary | ICD-10-CM

## 2015-08-08 ENCOUNTER — Inpatient Hospital Stay: Payer: Managed Care, Other (non HMO)

## 2015-08-11 ENCOUNTER — Inpatient Hospital Stay: Payer: 59

## 2015-08-11 ENCOUNTER — Inpatient Hospital Stay: Payer: 59 | Attending: Internal Medicine | Admitting: Internal Medicine

## 2015-08-11 ENCOUNTER — Encounter: Payer: Self-pay | Admitting: Internal Medicine

## 2015-08-11 VITALS — BP 133/87 | HR 85 | Temp 97.2°F | Ht 69.3 in | Wt 198.6 lb

## 2015-08-11 DIAGNOSIS — D649 Anemia, unspecified: Secondary | ICD-10-CM | POA: Diagnosis not present

## 2015-08-11 DIAGNOSIS — D696 Thrombocytopenia, unspecified: Secondary | ICD-10-CM | POA: Diagnosis present

## 2015-08-11 DIAGNOSIS — K439 Ventral hernia without obstruction or gangrene: Secondary | ICD-10-CM | POA: Diagnosis not present

## 2015-08-11 DIAGNOSIS — E669 Obesity, unspecified: Secondary | ICD-10-CM | POA: Insufficient documentation

## 2015-08-11 DIAGNOSIS — E785 Hyperlipidemia, unspecified: Secondary | ICD-10-CM | POA: Diagnosis not present

## 2015-08-11 DIAGNOSIS — N281 Cyst of kidney, acquired: Secondary | ICD-10-CM | POA: Diagnosis not present

## 2015-08-11 DIAGNOSIS — E039 Hypothyroidism, unspecified: Secondary | ICD-10-CM

## 2015-08-11 DIAGNOSIS — F419 Anxiety disorder, unspecified: Secondary | ICD-10-CM

## 2015-08-11 DIAGNOSIS — K219 Gastro-esophageal reflux disease without esophagitis: Secondary | ICD-10-CM | POA: Insufficient documentation

## 2015-08-11 DIAGNOSIS — N182 Chronic kidney disease, stage 2 (mild): Secondary | ICD-10-CM

## 2015-08-11 DIAGNOSIS — F329 Major depressive disorder, single episode, unspecified: Secondary | ICD-10-CM | POA: Diagnosis not present

## 2015-08-11 DIAGNOSIS — I2510 Atherosclerotic heart disease of native coronary artery without angina pectoris: Secondary | ICD-10-CM | POA: Diagnosis not present

## 2015-08-11 DIAGNOSIS — Z9884 Bariatric surgery status: Secondary | ICD-10-CM | POA: Diagnosis not present

## 2015-08-11 DIAGNOSIS — Z79899 Other long term (current) drug therapy: Secondary | ICD-10-CM | POA: Insufficient documentation

## 2015-08-11 DIAGNOSIS — I129 Hypertensive chronic kidney disease with stage 1 through stage 4 chronic kidney disease, or unspecified chronic kidney disease: Secondary | ICD-10-CM | POA: Diagnosis not present

## 2015-08-11 DIAGNOSIS — Z8619 Personal history of other infectious and parasitic diseases: Secondary | ICD-10-CM | POA: Diagnosis not present

## 2015-08-11 DIAGNOSIS — K5790 Diverticulosis of intestine, part unspecified, without perforation or abscess without bleeding: Secondary | ICD-10-CM | POA: Insufficient documentation

## 2015-08-11 DIAGNOSIS — Z7982 Long term (current) use of aspirin: Secondary | ICD-10-CM | POA: Diagnosis not present

## 2015-08-11 LAB — CBC WITH DIFFERENTIAL/PLATELET
Basophils Absolute: 0.1 10*3/uL (ref 0–0.1)
EOS ABS: 0.1 10*3/uL (ref 0–0.7)
HCT: 35.5 % — ABNORMAL LOW (ref 40.0–52.0)
HEMOGLOBIN: 11.9 g/dL — AB (ref 13.0–18.0)
Lymphocytes Relative: 19 %
Lymphs Abs: 1.9 10*3/uL (ref 1.0–3.6)
MCH: 34.5 pg — ABNORMAL HIGH (ref 26.0–34.0)
MCHC: 33.4 g/dL (ref 32.0–36.0)
MCV: 103.3 fL — ABNORMAL HIGH (ref 80.0–100.0)
Monocytes Absolute: 1.1 10*3/uL — ABNORMAL HIGH (ref 0.2–1.0)
Neutro Abs: 6.9 10*3/uL — ABNORMAL HIGH (ref 1.4–6.5)
PLATELETS: 185 10*3/uL (ref 150–440)
RBC: 3.44 MIL/uL — AB (ref 4.40–5.90)
RDW: 14.7 % — ABNORMAL HIGH (ref 11.5–14.5)
WBC: 9.9 10*3/uL (ref 3.8–10.6)

## 2015-08-11 LAB — RETICULOCYTES
RBC.: 3.48 MIL/uL — ABNORMAL LOW (ref 4.40–5.90)
RETIC COUNT ABSOLUTE: 73.1 10*3/uL (ref 19.0–183.0)
Retic Ct Pct: 2.1 % (ref 0.4–3.1)

## 2015-08-11 LAB — LACTATE DEHYDROGENASE: LDH: 201 U/L — AB (ref 98–192)

## 2015-08-11 LAB — FERRITIN: FERRITIN: 25 ng/mL (ref 24–336)

## 2015-08-11 NOTE — Progress Notes (Signed)
[place] @ [facility] Telephone:([phone]) [phone]  Fax:([phone]) Egan: Dec 19, 1955  MR#: [id number]  [facility]#:[id number]  Patient Care Team: Wenda Low, MD as PCP - General (Internal Medicine)  CHIEF COMPLAINT:  Chief Complaint  Patient presents with  . New Patient (Initial Visit)     No history exists.    Oncology Flowsheet 09/12/2011 09/13/2011 09/14/2011  ondansetron (ZOFRAN) IJ - - -  ondansetron (ZOFRAN) IV - 4 mg -    HISTORY OF PRESENT ILLNESS:   Alexander Holloway is a 60 year old gentleman who is referred to our clinic for evaluation of anemia and thrombocytopenia. He claims that he has been followed by his primary care physician on a regular basis for many years, and that the first time he heard about any blood work abnormalities was in November 2016. He does not have any significant complaints at this point, specifically, easy bruising, bleeding from any source, weight loss, nausea, vomiting, diarrhea, constipation. He has right leg minimal edema, which was investigated in November 2016 and showed no evidence of DVT. Alexander Holloway had a gastric bypass surgery in 1994, but he is not aware of any vitamin deficiencies in the course of the next 23 years. He is maintaining regular diet. He drinks 2 shots of whiskey per week.  REVIEW OF SYSTEMS:   Review of Systems  All other systems reviewed and are negative.    PAST MEDICAL HISTORY: Past Medical History  Diagnosis Date  . GERD (gastroesophageal reflux disease)   . Hernia     ventral  . Hepatitis     1974 -a  . Hypertension   . Hyperlipidemia   . Hypothyroidism   . Morbid obesity ([place])     s/p gastric bypass  . Diverticulosis   . Shingles   . Anxiety   . Hemorrhoids   . CKD (chronic kidney disease) stage 2, GFR 60-89 ml/min   . Coronary artery disease 2008    nonobstructive ASCAD  . Depression     PAST SURGICAL HISTORY: Past Surgical History  Procedure Laterality Date  . Gastic bypass  05/1993  .  Heart cath  08/2006  . Cardiac catheterization      4-5 yrs ago dr turner MED [place]   ?08/09  . Cardiovascular stress test      DR TURNER  05/2011  AT OFFICE  . Tonsillectomy      AS CHILD   . Ventral hernia repair  09/12/2011    Procedure: HERNIA REPAIR VENTRAL ADULT;  Surgeon: Rolm Bookbinder, MD;  Location: [place];  Service: General;  Laterality: N/A;  open ventral hernia with mesh , Partial component separation  . Panniculectomy  09/12/2011    Procedure: PANNICULECTOMY;  Surgeon: Rolm Bookbinder, MD;  Location: [place];  Service: General;  Laterality: N/A;  . Hernia repair  09/12/2011 and 09/14/11    ventral hernia  . Abdominal hernia repair Right !08/2011    FAMILY HISTORY Family History  Problem Relation Age of Onset  . Other Mother 49    circulatory  . Heart disease Father 80    ADVANCED DIRECTIVES:  No flowsheet data found.  HEALTH MAINTENANCE: Social History  Substance Use Topics  . Smoking status: Never Smoker   . Smokeless tobacco: Never Used  . Alcohol Use: 1.2 oz/week    2 Shots of liquor per week     Comment: social     No Known Allergies  Current Outpatient Prescriptions  Medication Sig Dispense Refill  .  amLODipine (NORVASC) 10 MG tablet Take 10 mg by mouth daily.     Marland Kitchen aspirin EC 81 MG tablet Take 81 mg by mouth daily.    . cholecalciferol (VITAMIN D) 1000 UNITS tablet Take 1,000 Units by mouth daily.    . cloNIDine (CATAPRES) 0.2 MG tablet Take 1 tablet by mouth 2 (two) times daily.    Marland Kitchen eprosartan (TEVETEN) 600 MG tablet Take 600 mg by mouth daily.     Marland Kitchen levothyroxine (SYNTHROID, LEVOTHROID) 100 MCG tablet Take 100 mcg by mouth daily.     . Multiple Vitamin (MULITIVITAMIN WITH MINERALS) TABS Take 1 tablet by mouth daily.    . simvastatin (ZOCOR) 20 MG tablet Take 20 mg by mouth daily.      No current facility-administered medications for this visit.    OBJECTIVE:  Filed Vitals:   08/11/15 0954  BP: 133/87  Pulse: 85  Temp: 97.2 F (36.2 C)      Body mass index is 29.08 kg/(m^2).    ECOG FS:0 - Asymptomatic  Physical Exam  Constitutional: He is oriented to person, place, and time and well-developed, well-nourished, and in no distress. No distress.  HENT:  Head: Normocephalic and atraumatic.  Right Ear: External ear normal.  Left Ear: External ear normal.  Nose: Nose normal.  Mouth/Throat: Oropharynx is clear and moist. No oropharyngeal exudate.  Eyes: Conjunctivae and EOM are normal. Pupils are equal, round, and reactive to light. Right eye exhibits no discharge. Left eye exhibits no discharge. No scleral icterus.  Neck: Normal range of motion. Neck supple. No JVD present. No tracheal deviation present. No thyromegaly present.  Cardiovascular: Normal rate, regular rhythm, normal heart sounds and intact distal pulses.  Exam reveals no gallop and no friction rub.   No murmur heard. Pulmonary/Chest: Effort normal and breath sounds normal. No stridor. No respiratory distress. He has no wheezes. He has no rales. He exhibits no tenderness.  Abdominal: Soft. Bowel sounds are normal. He exhibits no distension and no mass. There is no tenderness. There is no rebound and no guarding.  Postsurgical scar along the midline. Metallic mash is palpated  Genitourinary:  Patient deferred  Musculoskeletal: Normal range of motion. He exhibits no edema or tenderness.  Lymphadenopathy:    He has no cervical adenopathy.  Neurological: He is alert and oriented to person, place, and time. He has normal reflexes. No cranial nerve deficit. He exhibits normal muscle tone. Gait normal. Coordination normal. GCS score is 15.  Skin: Skin is warm and dry. No rash noted. He is not diaphoretic. No erythema. No pallor.  Psychiatric: Mood, memory, affect and judgment normal.  Nursing note and vitals reviewed.    LAB RESULTS:  CBC Latest Ref Rng 12/18/2011 09/18/2011  WBC 4.0 - 10.5 K/uL 6.1 7.1  Hemoglobin 13.0 - 17.0 g/dL 11.7(L) 7.6(L)  Hematocrit 39.0 - 52.0  % 34.7(L) 23.0(L)  Platelets 150 - 400 K/uL 192 176    No visits with results within 5 Day(s) from this visit. Latest known visit with results is:  Hospital Outpatient Visit on 12/18/2011  Component Date Value Ref Range Status  . Specimen Description 12/18/2011 ABSCESS ABDOMEN   Final  . Special Requests 12/18/2011 NONE   Final  . Gram Stain 12/18/2011    Final                   Value:MODERATE WBC PRESENT,BOTH PMN AND MONONUCLEAR  NO SQUAMOUS EPITHELIAL CELLS SEEN                         RARE GRAM POSITIVE COCCI IN PAIRS  . Culture 12/18/2011 NO GROWTH 3 DAYS   Final  . Report Status 12/18/2011 12/21/2011 FINAL   Final      STUDIES: US Venous Img Lower Unilateral Right  07/26/2015  CLINICAL DATA:  60 year old male with a history of right leg pain and swelling EXAM: RIGHT LOWER EXTREMITY VENOUS DOPPLER ULTRASOUND TECHNIQUE: Gray-scale sonography with graded compression, as well as color Doppler and duplex ultrasound were performed to evaluate the lower extremity deep venous systems from the level of the common femoral vein and including the common femoral, femoral, profunda femoral, popliteal and calf veins including the posterior tibial, peroneal and gastrocnemius veins when visible. The superficial great saphenous vein was also interrogated. Spectral Doppler was utilized to evaluate flow at rest and with distal augmentation maneuvers in the common femoral, femoral and popliteal veins. COMPARISON:  None. FINDINGS: Contralateral Common Femoral Vein: Respiratory phasicity is normal and symmetric with the symptomatic side. No evidence of thrombus. Normal compressibility. Common Femoral Vein: No evidence of thrombus. Normal compressibility, respiratory phasicity and response to augmentation. Saphenofemoral Junction: No evidence of thrombus. Normal compressibility and flow on color Doppler imaging. Profunda Femoral Vein: No evidence of thrombus. Normal compressibility and  flow on color Doppler imaging. Femoral Vein: No evidence of thrombus. Normal compressibility, respiratory phasicity and response to augmentation. Popliteal Vein: No evidence of thrombus. Normal compressibility, respiratory phasicity and response to augmentation. Calf Veins: No evidence of thrombus. Normal compressibility and flow on color Doppler imaging. Superficial Great Saphenous Vein: No evidence of thrombus. Normal compressibility and flow on color Doppler imaging. Other Findings:  Superficial edema of the right lower extremity. IMPRESSION: Sonographic survey of the right lower extremity negative for DVT. Edema of the right lower extremity. Signed, Dulcy Fanny. Earleen Newport, DO [facility] Electronically Signed   By: Corrie Mckusick D.O.   On: 07/26/2015 15:15    ASSESSMENT and MEDICAL DECISION MAKING:  Macrocytic Anemia and thrombocytopenia-there are several potential reasons for combination of macrocytic anemia and thrombocytopenia in a 60 year old man with history of gastric bypass surgery. First of all, vitamin B12 deficiency is a reasonable consideration, although he has done well for more than 20 years post surgery, which makes vitamin deficiency less likely. Iron deficiency also can be seen in a patient with gastric bypass, but macrocytosis is less likely to be associated with iron deficiency. Liver abnormalities, such as non-alcoholic steatohepatitis, alcoholic steatohepatitis with liver cirrhosis and hypersplenism also appear possibility in this case. Certainly, myelodysplastic syndrome is a consideration in this gentleman. We will start a workup by performing vitamin B12 and methylmalonic acid levels, along with ferritin and red blood cell folate measurements, reticulocyte count and LDH. We will also request ultrasound of the liver and spleen to evaluate for nonalcoholic steatohepatitis, liver cirrhosis and splenomegaly. If normal, we will consider  performing bone marrow biopsy to rule out mild dysplastic syndrome or other primary hematologic malignancies. It would be highly unlikely for a 60 year old man who developed ITP. She has no evidence of other autoimmune processes, such as rheumatoid arthritis or SLE, which could be associated with anemia and thrombocytopenia.  Patient expressed understanding and was in agreement with this plan. He also understands that He can call clinic at any time with any questions, concerns, or complaints.  No matching staging information was found for the patient.  Roxana Hires, MD   08/11/2015 10:04 AM

## 2015-08-12 LAB — VITAMIN B12: VITAMIN B 12: 974 pg/mL — AB (ref 180–914)

## 2015-08-12 LAB — FOLATE RBC: Hematocrit: 35.5 % — ABNORMAL LOW (ref 37.5–51.0)

## 2015-08-13 LAB — METHYLMALONIC ACID, SERUM: Methylmalonic Acid, Quantitative: 147 nmol/L (ref 0–378)

## 2015-08-16 ENCOUNTER — Ambulatory Visit
Admission: RE | Admit: 2015-08-16 | Discharge: 2015-08-16 | Disposition: A | Discharge status: Home / Self Care | Payer: 59 | Source: Ambulatory Visit | Attending: Internal Medicine | Admitting: Internal Medicine

## 2015-08-16 DIAGNOSIS — Z9049 Acquired absence of other specified parts of digestive tract: Secondary | ICD-10-CM | POA: Insufficient documentation

## 2015-08-16 DIAGNOSIS — K7689 Other specified diseases of liver: Secondary | ICD-10-CM | POA: Diagnosis not present

## 2015-08-16 DIAGNOSIS — N281 Cyst of kidney, acquired: Secondary | ICD-10-CM | POA: Diagnosis not present

## 2015-08-16 DIAGNOSIS — Z9884 Bariatric surgery status: Secondary | ICD-10-CM | POA: Diagnosis not present

## 2015-08-16 DIAGNOSIS — D696 Thrombocytopenia, unspecified: Secondary | ICD-10-CM | POA: Insufficient documentation

## 2015-08-25 ENCOUNTER — Inpatient Hospital Stay (HOSPITAL_BASED_OUTPATIENT_CLINIC_OR_DEPARTMENT_OTHER): Payer: 59 | Admitting: Internal Medicine

## 2015-08-25 ENCOUNTER — Encounter: Payer: Self-pay | Admitting: Internal Medicine

## 2015-08-25 VITALS — BP 129/79 | HR 65 | Temp 97.9°F | Resp 18 | Ht 69.3 in | Wt 195.5 lb

## 2015-08-25 DIAGNOSIS — Z9884 Bariatric surgery status: Secondary | ICD-10-CM

## 2015-08-25 DIAGNOSIS — N281 Cyst of kidney, acquired: Secondary | ICD-10-CM

## 2015-08-25 DIAGNOSIS — Z7982 Long term (current) use of aspirin: Secondary | ICD-10-CM

## 2015-08-25 DIAGNOSIS — K439 Ventral hernia without obstruction or gangrene: Secondary | ICD-10-CM

## 2015-08-25 DIAGNOSIS — F419 Anxiety disorder, unspecified: Secondary | ICD-10-CM

## 2015-08-25 DIAGNOSIS — N182 Chronic kidney disease, stage 2 (mild): Secondary | ICD-10-CM

## 2015-08-25 DIAGNOSIS — D696 Thrombocytopenia, unspecified: Secondary | ICD-10-CM | POA: Diagnosis not present

## 2015-08-25 DIAGNOSIS — I129 Hypertensive chronic kidney disease with stage 1 through stage 4 chronic kidney disease, or unspecified chronic kidney disease: Secondary | ICD-10-CM

## 2015-08-25 DIAGNOSIS — E669 Obesity, unspecified: Secondary | ICD-10-CM

## 2015-08-25 DIAGNOSIS — Z8619 Personal history of other infectious and parasitic diseases: Secondary | ICD-10-CM

## 2015-08-25 DIAGNOSIS — Z79899 Other long term (current) drug therapy: Secondary | ICD-10-CM

## 2015-08-25 DIAGNOSIS — D649 Anemia, unspecified: Secondary | ICD-10-CM | POA: Diagnosis not present

## 2015-08-25 DIAGNOSIS — E785 Hyperlipidemia, unspecified: Secondary | ICD-10-CM

## 2015-08-25 DIAGNOSIS — K219 Gastro-esophageal reflux disease without esophagitis: Secondary | ICD-10-CM

## 2015-08-25 DIAGNOSIS — K5790 Diverticulosis of intestine, part unspecified, without perforation or abscess without bleeding: Secondary | ICD-10-CM

## 2015-08-25 DIAGNOSIS — F329 Major depressive disorder, single episode, unspecified: Secondary | ICD-10-CM

## 2015-08-25 DIAGNOSIS — E039 Hypothyroidism, unspecified: Secondary | ICD-10-CM

## 2015-08-25 DIAGNOSIS — I2510 Atherosclerotic heart disease of native coronary artery without angina pectoris: Secondary | ICD-10-CM

## 2015-08-25 DIAGNOSIS — D539 Nutritional anemia, unspecified: Secondary | ICD-10-CM

## 2015-08-25 NOTE — Progress Notes (Signed)
[name] @ [facility] Telephone:([phone]  Fax:([phone]) [place]: 09-01-1955  MR#: [id number]  GHW#:[id number]  Patient Care Team: Wenda Low, MD as PCP - General (Internal Medicine)  CHIEF COMPLAINT:  No chief complaint on file.    No history exists.    Oncology Flowsheet 09/12/2011 09/13/2011 09/14/2011  ondansetron (ZOFRAN) IJ - - -  ondansetron (ZOFRAN) IV - 4 mg -    HISTORY OF PRESENT ILLNESS:   Mr. Alexander Holloway is a 60 year old gentleman who is referred to our clinic for evaluation of anemia and thrombocytopenia. He claims that he has been followed by his primary care physician on a regular basis for many years, and that the first time he heard about any blood work abnormalities was in November 2016. He does not have any significant complaints at this point, specifically, easy bruising, bleeding from any source, weight loss, nausea, vomiting, diarrhea, constipation. He has right leg minimal edema, which was investigated in November 2016 and showed no evidence of DVT. Alexander Holloway had a gastric bypass surgery in 1994, but he is not aware of any vitamin deficiencies in the course of the next 23 years. He is maintaining regular diet. He drinks 2 shots of whiskey per week.  Current status: Alexander Holloway returns to our clinic today to discuss the results of the workup. He continues to feel well and has no new complaints. His leg swelling has improved.  REVIEW OF SYSTEMS:   Review of Systems  All other systems reviewed and are negative.    PAST MEDICAL HISTORY: Past Medical History  Diagnosis Date  . GERD (gastroesophageal reflux disease)   . Hernia     ventral  . Hepatitis     1974 -a  . Hypertension   . Hyperlipidemia   . Hypothyroidism   . Morbid obesity ([place])     s/p gastric bypass  . Diverticulosis   . Shingles   . Anxiety   . Hemorrhoids   . CKD (chronic kidney disease) stage 2, GFR 60-89 ml/min   . Coronary artery disease 2008   nonobstructive ASCAD  . Depression     PAST SURGICAL HISTORY: Past Surgical History  Procedure Laterality Date  . Gastic bypass  05/1993  . Heart cath  08/2006  . Cardiac catheterization      4-5 yrs ago dr turner MED [place]   ?08/09  . Cardiovascular stress test      DR TURNER  05/2011  AT OFFICE  . Tonsillectomy      AS CHILD   . Ventral hernia repair  09/12/2011    Procedure: HERNIA REPAIR VENTRAL ADULT;  Surgeon: Rolm Bookbinder, MD;  Location: [place];  Service: General;  Laterality: N/A;  open ventral hernia with mesh , Partial component separation  . Panniculectomy  09/12/2011    Procedure: PANNICULECTOMY;  Surgeon: Rolm Bookbinder, MD;  Location: [place];  Service: General;  Laterality: N/A;  . Hernia repair  09/12/2011 and 09/14/11    ventral hernia  . Abdominal hernia repair Right !08/2011    FAMILY HISTORY Family History  Problem Relation Age of Onset  . Other Mother 25    circulatory  . Heart disease Father 49    ADVANCED DIRECTIVES:  No flowsheet data found.  HEALTH MAINTENANCE: Social History  Substance Use Topics  . Smoking status: Never Smoker   . Smokeless tobacco: Never Used  . Alcohol Use: 1.2 oz/week    2 Shots of liquor per week  Comment: social     No Known Allergies  Current Outpatient Prescriptions  Medication Sig Dispense Refill  . amLODipine (NORVASC) 10 MG tablet Take 10 mg by mouth daily.     Marland Kitchen aspirin EC 81 MG tablet Take 81 mg by mouth daily.    . cholecalciferol (VITAMIN D) 1000 UNITS tablet Take 1,000 Units by mouth daily.    . cloNIDine (CATAPRES) 0.2 MG tablet Take 1 tablet by mouth 2 (two) times daily.    Marland Kitchen eprosartan (TEVETEN) 600 MG tablet Take 600 mg by mouth daily.     Marland Kitchen levothyroxine (SYNTHROID, LEVOTHROID) 100 MCG tablet Take 100 mcg by mouth daily.     . Multiple Vitamin (MULITIVITAMIN WITH MINERALS) TABS Take 1 tablet by mouth daily.    . simvastatin (ZOCOR) 20 MG tablet Take 20 mg by mouth daily.      No current  facility-administered medications for this visit.    OBJECTIVE:  Filed Vitals:   08/25/15 0919  BP: 129/79  Pulse: 65  Temp: 97.9 F (36.6 C)  Resp: 18     Body mass index is 28.64 kg/(m^2).    ECOG FS:0 - Asymptomatic  Physical Exam  Constitutional: He is oriented to person, place, and time and well-developed, well-nourished, and in no distress. No distress.  HENT:  Head: Normocephalic and atraumatic.  Right Ear: External ear normal.  Left Ear: External ear normal.  Nose: Nose normal.  Mouth/Throat: Oropharynx is clear and moist. No oropharyngeal exudate.  Eyes: Conjunctivae and EOM are normal. Pupils are equal, round, and reactive to light. Right eye exhibits no discharge. Left eye exhibits no discharge. No scleral icterus.  Neck: Normal range of motion. Neck supple. No JVD present. No tracheal deviation present. No thyromegaly present.  Cardiovascular: Normal rate, regular rhythm, normal heart sounds and intact distal pulses.  Exam reveals no gallop and no friction rub.   No murmur heard. Pulmonary/Chest: Effort normal and breath sounds normal. No stridor. No respiratory distress. He has no wheezes. He has no rales. He exhibits no tenderness.  Abdominal: Soft. Bowel sounds are normal. He exhibits no distension and no mass. There is no tenderness. There is no rebound and no guarding.  Postsurgical scar along the midline. Metallic mash is palpated  Genitourinary:  Patient deferred  Musculoskeletal: Normal range of motion. He exhibits no edema or tenderness.  Lymphadenopathy:    He has no cervical adenopathy.  Neurological: He is alert and oriented to person, place, and time. He has normal reflexes. No cranial nerve deficit. He exhibits normal muscle tone. Gait normal. Coordination normal. GCS score is 15.  Skin: Skin is warm and dry. No rash noted. He is not diaphoretic. No erythema. No pallor.  Psychiatric: Mood, memory, affect and judgment normal.  Nursing note and vitals  reviewed.    LAB RESULTS:  CBC Latest Ref Rng 08/11/2015 08/11/2015  WBC 3.8 - 10.6 K/uL 9.9 -  Hemoglobin 13.0 - 18.0 g/dL 11.9(L) -  Hematocrit 37.5 - 51.0 % 35.5(L) 35.5(L)  Platelets 150 - 440 K/uL 185 -    No visits with results within 5 Day(s) from this visit. Latest known visit with results is:  Appointment on 08/11/2015  Component Date Value Ref Range Status  . WBC 08/11/2015 9.9  3.8 - 10.6 K/uL Final  . RBC 08/11/2015 3.44* 4.40 - 5.90 MIL/uL Final  . Hemoglobin 08/11/2015 11.9* 13.0 - 18.0 g/dL Final  . HCT 08/11/2015 35.5* 40.0 - 52.0 % Final  . MCV 08/11/2015  103.3* 80.0 - 100.0 fL Final  . MCH 08/11/2015 34.5* 26.0 - 34.0 pg Final  . MCHC 08/11/2015 33.4  32.0 - 36.0 g/dL Final  . RDW 08/11/2015 14.7* 11.5 - 14.5 % Final  . Platelets 08/11/2015 185  150 - 440 K/uL Final  . Neutrophils Relative % 08/11/2015 70%   Final  . Neutro Abs 08/11/2015 6.9* 1.4 - 6.5 K/uL Final  . Lymphocytes Relative 08/11/2015 19%   Final  . Lymphs Abs 08/11/2015 1.9  1.0 - 3.6 K/uL Final  . Monocytes Relative 08/11/2015 11%   Final  . Monocytes Absolute 08/11/2015 1.1* 0.2 - 1.0 K/uL Final  . Eosinophils Relative 08/11/2015 1%   Final  . Eosinophils Absolute 08/11/2015 0.1  0 - 0.7 K/uL Final  . Basophils Relative 08/11/2015 1%   Final  . Basophils Absolute 08/11/2015 0.1  0 - 0.1 K/uL Final  . Vitamin B-12 08/11/2015 974* 180 - 914 pg/mL Final   Comment: (NOTE) This assay is not validated for testing neonatal or myeloproliferative syndrome specimens for Vitamin B12 levels. Performed at [facility]   . LDH 08/11/2015 201* 98 - 192 U/L Final  . Ferritin 08/11/2015 25  24 - 336 ng/mL Final  . Retic Ct Pct 08/11/2015 2.1  0.4 - 3.1 % Final  . RBC. 08/11/2015 3.48* 4.40 - 5.90 MIL/uL Final  . Retic Count, Manual 08/11/2015 73.1  19.0 - 183.0 K/uL Final  . Methylmalonic Acid, Quantitative 08/11/2015 147  0 - 378 nmol/L Final   Comment: (NOTE) Performed At: [facility] [place], [place] [id number] Lindon Romp MD [id number]   . Folate, Hemolysate 08/11/2015 >620.0  Not Estab. ng/mL Final  . Hematocrit 08/11/2015 35.5* 37.5 - 51.0 % Final  . Folate, RBC 08/11/2015 >1746  >498 ng/mL Final   Comment: (NOTE) Performed At: [facility] [place], [place] [id number] Lindon Romp MD BW:[id number]       STUDIES: US Abdomen Complete  08/16/2015  CLINICAL DATA:  Thrombocytopenia. Cholecystectomy and gastric bypass surgery. EXAM: ABDOMEN ULTRASOUND COMPLETE COMPARISON:  12/11/2011 CT abdomen/ pelvis. FINDINGS: Gallbladder: Surgically absent. Common bile duct: Diameter: 2 mm Liver: Simple 1.1 x 1.4 x 1.3 cm liver cyst in the subcapsular liver adjacent to the gallbladder fossa, stable since 12/11/2011 CT. No additional liver lesions. Liver parenchymal echogenicity and echotexture are within normal limits. IVC: No abnormality visualized. Pancreas: Poorly visualized due to patient related factors. Spleen: Size and appearance within normal limits. Craniocaudal splenic length 6.2 cm. Maximum axial splenic dimensions 10.9 x 3.9 cm. Right Kidney: Length: 11.4 cm. Echogenicity within normal limits. No mass or hydronephrosis visualized. Left Kidney: Length: 11.6 cm. Echogenicity within normal limits. No mass or hydronephrosis visualized. Simple 2.8 x 2.6 x 3.5 cm renal cyst in the medial upper right kidney, not appreciably changed since 12/11/2011 CT. Abdominal aorta: No aneurysm visualized. Other findings: None. IMPRESSION: 1. Normal size spleen. 2. Status post cholecystectomy.  No biliary ductal dilatation. 3. Stable simple liver and left renal cysts. Electronically Signed   By: Ilona Sorrel M.D.   On: 08/16/2015 10:18   US Venous Img Lower Unilateral Right  07/26/2015  CLINICAL DATA:  60 year old male with a history of right leg pain and swelling EXAM: RIGHT LOWER EXTREMITY VENOUS DOPPLER ULTRASOUND TECHNIQUE: Gray-scale  sonography with graded compression, as well as color Doppler and duplex ultrasound were performed to evaluate the lower extremity deep venous systems from the level of the common femoral vein  and including the common femoral, femoral, profunda femoral, popliteal and calf veins including the posterior tibial, peroneal and gastrocnemius veins when visible. The superficial great saphenous vein was also interrogated. Spectral Doppler was utilized to evaluate flow at rest and with distal augmentation maneuvers in the common femoral, femoral and popliteal veins. COMPARISON:  None. FINDINGS: Contralateral Common Femoral Vein: Respiratory phasicity is normal and symmetric with the symptomatic side. No evidence of thrombus. Normal compressibility. Common Femoral Vein: No evidence of thrombus. Normal compressibility, respiratory phasicity and response to augmentation. Saphenofemoral Junction: No evidence of thrombus. Normal compressibility and flow on color Doppler imaging. Profunda Femoral Vein: No evidence of thrombus. Normal compressibility and flow on color Doppler imaging. Femoral Vein: No evidence of thrombus. Normal compressibility, respiratory phasicity and response to augmentation. Popliteal Vein: No evidence of thrombus. Normal compressibility, respiratory phasicity and response to augmentation. Calf Veins: No evidence of thrombus. Normal compressibility and flow on color Doppler imaging. Superficial Great Saphenous Vein: No evidence of thrombus. Normal compressibility and flow on color Doppler imaging. Other Findings:  Superficial edema of the right lower extremity. IMPRESSION: Sonographic survey of the right lower extremity negative for DVT. Edema of the right lower extremity. Signed, Dulcy Fanny. Earleen Newport, DO [facility] Electronically Signed   By: Corrie Mckusick D.O.   On: 07/26/2015 15:15    ASSESSMENT and MEDICAL DECISION MAKING:  Macrocytic Anemia and  thrombocytopenia-thrombocytopenia is no longer present. There is mild macrocytic anemia with normal vitamin B12, folate levels, reticulocyte count, normal appearance of liver and spleen. There is ferritin level at the lower limit of normal, which should not be responsible for macrocytic anemia. Review of medications does not reveal any potential culprit for the abnormalities which are observed (Lasix has been associated with hemolytic anemia, but there is no clear indication of hemolysis on the lab work) We are asked Mr. Carreno to start iron supplementation in the form of ferrous sulfate 325 mg 3 times a day with meals, and continued for 3 months. At that point he will return to our clinic and have ferritin rechecked along with CBC. If there is no improvement in ferritin level, he will need IV iron, which is expected in a patient with gastric bypass surgery. He will also call us if he is unable to tolerate oral iron supplementation. We should monitor his CBC and every 3 months basis, and if there is further decline in hemoglobin or other blood lineages, we may need to perform bone marrow biopsy. I would not pursue bone marrow biopsy at this time, taking into account stability of his parameters since 2013.   Patient expressed understanding and was in agreement with this plan. He also understands that He can call clinic at any time with any questions, concerns, or complaints.    No matching staging information was found for the patient.  Roxana Hires, MD   08/25/2015 9:11 AM

## 2015-08-25 NOTE — Patient Instructions (Signed)
Please start ferrous sulfate 325 mg three times a day with food.  Please contact md office if you experience constipation or stomach  While on iron pills.

## 2015-08-25 NOTE — Progress Notes (Signed)
Pt feels good today, he purchased copper socks to help with foot pain and swelling and it is much better. His PCP put him on fluid pill every other day and it is working. He gets labs monitored there on regular basis.  He has no pain today. He is here to get results of labs and u/s for his work up of anemia and thrombocytopenia

## 2015-11-24 ENCOUNTER — Other Ambulatory Visit: Payer: 59

## 2015-11-24 ENCOUNTER — Other Ambulatory Visit: Payer: Self-pay | Admitting: *Deleted

## 2015-11-24 ENCOUNTER — Inpatient Hospital Stay: Payer: 59 | Attending: Internal Medicine | Admitting: Family Medicine

## 2015-11-24 ENCOUNTER — Encounter: Payer: Self-pay | Admitting: Family Medicine

## 2015-11-24 VITALS — BP 106/69 | HR 69 | Temp 97.0°F | Resp 20 | Wt 194.0 lb

## 2015-11-24 DIAGNOSIS — E039 Hypothyroidism, unspecified: Secondary | ICD-10-CM | POA: Diagnosis not present

## 2015-11-24 DIAGNOSIS — D696 Thrombocytopenia, unspecified: Secondary | ICD-10-CM

## 2015-11-24 DIAGNOSIS — N182 Chronic kidney disease, stage 2 (mild): Secondary | ICD-10-CM

## 2015-11-24 DIAGNOSIS — D649 Anemia, unspecified: Secondary | ICD-10-CM | POA: Insufficient documentation

## 2015-11-24 DIAGNOSIS — Z79899 Other long term (current) drug therapy: Secondary | ICD-10-CM | POA: Insufficient documentation

## 2015-11-24 DIAGNOSIS — K5790 Diverticulosis of intestine, part unspecified, without perforation or abscess without bleeding: Secondary | ICD-10-CM | POA: Diagnosis not present

## 2015-11-24 DIAGNOSIS — Z7982 Long term (current) use of aspirin: Secondary | ICD-10-CM | POA: Insufficient documentation

## 2015-11-24 DIAGNOSIS — F329 Major depressive disorder, single episode, unspecified: Secondary | ICD-10-CM

## 2015-11-24 DIAGNOSIS — K439 Ventral hernia without obstruction or gangrene: Secondary | ICD-10-CM | POA: Insufficient documentation

## 2015-11-24 DIAGNOSIS — E785 Hyperlipidemia, unspecified: Secondary | ICD-10-CM | POA: Diagnosis not present

## 2015-11-24 DIAGNOSIS — I129 Hypertensive chronic kidney disease with stage 1 through stage 4 chronic kidney disease, or unspecified chronic kidney disease: Secondary | ICD-10-CM | POA: Diagnosis not present

## 2015-11-24 DIAGNOSIS — F419 Anxiety disorder, unspecified: Secondary | ICD-10-CM | POA: Diagnosis not present

## 2015-11-24 DIAGNOSIS — Z9884 Bariatric surgery status: Secondary | ICD-10-CM | POA: Insufficient documentation

## 2015-11-24 DIAGNOSIS — K219 Gastro-esophageal reflux disease without esophagitis: Secondary | ICD-10-CM | POA: Diagnosis not present

## 2015-11-24 DIAGNOSIS — E669 Obesity, unspecified: Secondary | ICD-10-CM

## 2015-11-24 HISTORY — DX: Thrombocytopenia, unspecified: D69.6

## 2015-11-24 LAB — CBC WITH DIFFERENTIAL/PLATELET
BASOS ABS: 0 10*3/uL (ref 0–0.1)
BASOS PCT: 0 %
EOS ABS: 0.1 10*3/uL (ref 0–0.7)
EOS PCT: 1 %
HCT: 36.3 % — ABNORMAL LOW (ref 40.0–52.0)
Hemoglobin: 11.8 g/dL — ABNORMAL LOW (ref 13.0–18.0)
LYMPHS PCT: 31 %
Lymphs Abs: 1.9 10*3/uL (ref 1.0–3.6)
MCH: 33.3 pg (ref 26.0–34.0)
MCHC: 32.5 g/dL (ref 32.0–36.0)
MCV: 102.6 fL — AB (ref 80.0–100.0)
Monocytes Absolute: 0.6 10*3/uL (ref 0.2–1.0)
Monocytes Relative: 9 %
NEUTROS PCT: 59 %
Neutro Abs: 3.6 10*3/uL (ref 1.4–6.5)
PLATELETS: 122 10*3/uL — AB (ref 150–440)
RBC: 3.54 MIL/uL — AB (ref 4.40–5.90)
RDW: 13.3 % (ref 11.5–14.5)
WBC: 6.1 10*3/uL (ref 3.8–10.6)

## 2015-11-24 NOTE — Progress Notes (Signed)
Patient here for thrombocytopenia follow up. Doing well. Has no bleeding that he is aware of anywhere. Appetite is good. Energy is good.Reports sleeps good averages 6 hours a night.No dizziness. Having some mild Left hip pain,states it could be from playing too much golf. Rates it a 1-2 on pain scale/10.

## 2015-11-24 NOTE — Progress Notes (Signed)
[facility]  Telephone:(336) [phone]  Fax:(336) [phone]     Alexander Holloway DOB: 08/20/1955  MR#: [id number]  QIO#:[id number]#:[id number]  Patient Care Team: Georgann HousekeeperKarrar Husain, MD as PCP - General (Internal Medicine)  CHIEF COMPLAINT:  Thrombocytopenia, Macrocytic Anemia  INTERVAL HISTORY:  Patient is here for further evaluation regarding anemia and thrombocytopenia. He reports that he has been followed by his primary care physician on a regular basis for many years, and that the first time he heard about any blood work abnormalities was in November 2016. He does not have any significant complaints at this point, specifically, easy bruising, bleeding from any source, weight loss, nausea, vomiting, diarrhea, constipation. He also had a gastric bypass surgery in 1994, but he is not aware of any vitamin deficiencies. He is maintaining regular diet. He drinks 2 shots of whiskey per week. Overall he reports feeling very well and denies any acute complaints.  REVIEW OF SYSTEMS:   Review of Systems  Constitutional: Negative for fever, chills, weight loss, malaise/fatigue and diaphoresis.  HENT: Negative.   Eyes: Negative.   Respiratory: Negative for cough, hemoptysis, sputum production, shortness of breath and wheezing.   Cardiovascular: Negative for chest pain, palpitations, orthopnea, claudication, leg swelling and PND.  Gastrointestinal: Negative for heartburn, nausea, vomiting, abdominal pain, diarrhea, constipation, blood in stool and melena.  Genitourinary: Negative.   Musculoskeletal: Negative.   Skin: Negative.   Neurological: Negative for dizziness, tingling, focal weakness, seizures and weakness.  Endo/Heme/Allergies: Does not bruise/bleed easily.  Psychiatric/Behavioral: Negative for depression. The patient is not nervous/anxious and does not have insomnia.     As per HPI. Otherwise, a complete review of systems is negatve.   PAST MEDICAL HISTORY: Past Medical History    Diagnosis Date  . GERD (gastroesophageal reflux disease)   . Hernia     ventral  . Hepatitis     1974 -a  . Hypertension   . Hyperlipidemia   . Hypothyroidism   . Morbid obesity ([facility])     s/p gastric bypass  . Diverticulosis   . Shingles   . Anxiety   . Hemorrhoids   . CKD (chronic kidney disease) stage 2, GFR 60-89 ml/min   . Coronary artery disease 2008    nonobstructive ASCAD  . Depression   . Anemia   . Thrombocytopenia ([facility])   . Thrombocytopenia ([facility]) 11/24/2015    PAST SURGICAL HISTORY: Past Surgical History  Procedure Laterality Date  . Gastic bypass  05/1993  . Heart cath  08/2006  . Cardiac catheterization      4-5 yrs ago dr turner MED [place]   ?08/09  . Cardiovascular stress test      DR TURNER  05/2011  AT OFFICE  . Tonsillectomy      AS CHILD   . Ventral hernia repair  09/12/2011    Procedure: HERNIA REPAIR VENTRAL ADULT;  Surgeon: Emelia LoronMatthew Wakefield, MD;  [facility];  Service: General;  Laterality: N/A;  open ventral hernia with mesh , Partial component separation  . Panniculectomy  09/12/2011    Procedure: PANNICULECTOMY;  Surgeon: Emelia LoronMatthew Wakefield, MD;  [facility];  Service: General;  Laterality: N/A;  . Hernia repair  09/12/2011 and 09/14/11    ventral hernia  . Abdominal hernia repair Right !08/2011    FAMILY HISTORY Family History  Problem Relation Age of Onset  . Other Mother 8554    circulatory  . Heart disease Father 1547    GYNECOLOGIC HISTORY:  No LMP for  male patient.     ADVANCED DIRECTIVES:    HEALTH MAINTENANCE: Social History  Substance Use Topics  . Smoking status: Never Smoker   . Smokeless tobacco: Never Used  . Alcohol Use: 1.2 oz/week    2 Shots of liquor per week     Comment: social    No Known Allergies  Current Outpatient Prescriptions  Medication Sig Dispense Refill  . amLODipine (NORVASC) 10 MG tablet Take 10 mg by mouth daily.     Marland Kitchen aspirin EC 81 MG tablet Take 81 mg by mouth daily.    . cholecalciferol  (VITAMIN D) 1000 UNITS tablet Take 1,000 Units by mouth daily.    . cloNIDine (CATAPRES) 0.2 MG tablet Take 1 tablet by mouth 2 (two) times daily.    Marland Kitchen eprosartan (TEVETEN) 600 MG tablet Take 600 mg by mouth daily.     . furosemide (LASIX) 20 MG tablet Take 20 mg by mouth every other day.    . levothyroxine (SYNTHROID, LEVOTHROID) 100 MCG tablet Take 100 mcg by mouth daily.     . Multiple Vitamin (MULITIVITAMIN WITH MINERALS) TABS Take 1 tablet by mouth daily.    . simvastatin (ZOCOR) 20 MG tablet Take 20 mg by mouth daily.      No current facility-administered medications for this visit.    OBJECTIVE: BP 106/69 mmHg  Pulse 69  Temp(Src) 97 F (36.1 C) (Tympanic)  Resp 20  Wt 194 lb 0.1 oz (88 kg)   Body mass index is 28.41 kg/(m^2).    ECOG FS:0 - Asymptomatic  General: Well-developed, well-nourished, no acute distress. Eyes: Pink conjunctiva, anicteric sclera. HEENT: Normocephalic, moist mucous membranes, clear oropharnyx. Lungs: Clear to auscultation bilaterally. Heart: Regular rate and rhythm. No rubs, murmurs, or gallops. Abdomen: Soft, nontender, nondistended. No organomegaly noted, normoactive bowel sounds. Musculoskeletal: No edema, cyanosis, or clubbing. Neuro: Alert, answering all questions appropriately. Cranial nerves grossly intact. Skin: No rashes, bruising, or petechiae noted. Psych: Normal affect.   LAB RESULTS:  Appointment on 11/24/2015  Component Date Value Ref Range Status  . WBC 11/24/2015 6.1  3.8 - 10.6 K/uL Final  . RBC 11/24/2015 3.54* 4.40 - 5.90 MIL/uL Final  . Hemoglobin 11/24/2015 11.8* 13.0 - 18.0 g/dL Final  . HCT 16/04/9603 36.3* 40.0 - 52.0 % Final  . MCV 11/24/2015 102.6* 80.0 - 100.0 fL Final  . MCH 11/24/2015 33.3  26.0 - 34.0 pg Final  . MCHC 11/24/2015 32.5  32.0 - 36.0 g/dL Final  . RDW 54/03/8118 13.3  11.5 - 14.5 % Final  . Platelets 11/24/2015 122* 150 - 440 K/uL Final  . Neutrophils Relative % 11/24/2015 59   Final  . Neutro Abs  11/24/2015 3.6  1.4 - 6.5 K/uL Final  . Lymphocytes Relative 11/24/2015 31   Final  . Lymphs Abs 11/24/2015 1.9  1.0 - 3.6 K/uL Final  . Monocytes Relative 11/24/2015 9   Final  . Monocytes Absolute 11/24/2015 0.6  0.2 - 1.0 K/uL Final  . Eosinophils Relative 11/24/2015 1   Final  . Eosinophils Absolute 11/24/2015 0.1  0 - 0.7 K/uL Final  . Basophils Relative 11/24/2015 0   Final  . Basophils Absolute 11/24/2015 0.0  0 - 0.1 K/uL Final    STUDIES: No results found.  ASSESSMENT:  Macrocytic Anemia.  Thrombocytopenia.  PLAN:   1. Anemia. There is mild macrocytic anemia with normal vitamin B12, folate levels, reticulocyte count, normal appearance of liver and spleen. There is ferritin level at the lower  limit of normal, which should not be responsible for macrocytic anemia. Review of medications does not reveal any potential culprit for the abnormalities which are observed (Lasix has been associated with hemolytic anemia, but there is no clear indication of hemolysis on the lab work) Advised patient to continue with iron supplementation in the form of ferrous sulfate 325 mg 3 times a day with meals. Will recheck ferritin along with CBC in approximately 3 months. If there is no improvement in ferritin level, he will need IV iron, which is expected in a patient with gastric bypass surgery. He will also call us if he is unable to tolerate oral iron supplementation. 2. Thrombocytopenia. Dip in platelets from last visit 3 months ago. From 185 to 122. Advised patient to limit consumption of alcohol. He will notify us with any bruising, bright red bleeding, or petechiae.  Patient will return in 3 months for continued follow up.  Patient expressed understanding and was in agreement with this plan. He also understands that He can call clinic at any time with any questions, concerns, or complaints.   Dr. Doylene Canning was available for consultation and review of plan of care for this patient.  Loann Quill, NP   11/24/2015 9:27 AM

## 2015-12-29 DEATH — deceased

## 2016-02-23 ENCOUNTER — Other Ambulatory Visit: Payer: 59

## 2016-02-23 ENCOUNTER — Ambulatory Visit: Payer: 59 | Admitting: Family Medicine
